# Patient Record
Sex: Female | Born: 1955 | Race: White | Hispanic: No | Smoking: Never smoker
Health system: Southern US, Community
[De-identification: ages and names within clinical notes are randomized; demographics above are authoritative.]

## PROBLEM LIST (undated history)

## (undated) DIAGNOSIS — M1990 Unspecified osteoarthritis, unspecified site: Secondary | ICD-10-CM

## (undated) DIAGNOSIS — M549 Dorsalgia, unspecified: Secondary | ICD-10-CM

## (undated) DIAGNOSIS — E213 Hyperparathyroidism, unspecified: Secondary | ICD-10-CM

## (undated) DIAGNOSIS — I10 Essential (primary) hypertension: Secondary | ICD-10-CM

## (undated) DIAGNOSIS — G8929 Other chronic pain: Secondary | ICD-10-CM

## (undated) HISTORY — DX: Hyperparathyroidism, unspecified: E21.3

## (undated) HISTORY — PX: APPENDECTOMY: SHX54

---

## 1992-07-09 HISTORY — PX: ABDOMINAL HYSTERECTOMY: SHX81

## 2001-02-10 ENCOUNTER — Other Ambulatory Visit: Admission: RE | Admit: 2001-02-10 | Discharge: 2001-02-10 | Payer: Self-pay | Admitting: Dermatology

## 2003-10-22 ENCOUNTER — Ambulatory Visit (HOSPITAL_COMMUNITY): Admission: RE | Admit: 2003-10-22 | Discharge: 2003-10-22 | Payer: Self-pay | Admitting: Family Medicine

## 2003-12-17 ENCOUNTER — Ambulatory Visit (HOSPITAL_COMMUNITY): Admission: RE | Admit: 2003-12-17 | Discharge: 2003-12-17 | Payer: Self-pay | Admitting: Family Medicine

## 2007-01-09 ENCOUNTER — Emergency Department (HOSPITAL_COMMUNITY): Admission: EM | Admit: 2007-01-09 | Discharge: 2007-01-09 | Payer: Self-pay | Admitting: Emergency Medicine

## 2007-01-17 ENCOUNTER — Ambulatory Visit (HOSPITAL_COMMUNITY): Admission: RE | Admit: 2007-01-17 | Discharge: 2007-01-17 | Payer: Self-pay | Admitting: Internal Medicine

## 2008-09-18 IMAGING — CR DG HIP COMPLETE 2+V*R*
3 series · 3 of 3 positions shown · non-contrast
Comparison: none

CLINICAL DATA: Right hip injury.  Migraine headaches. 
 RIGHT HIP ? 3 VIEW:

[view not recorded (1 of 3)]
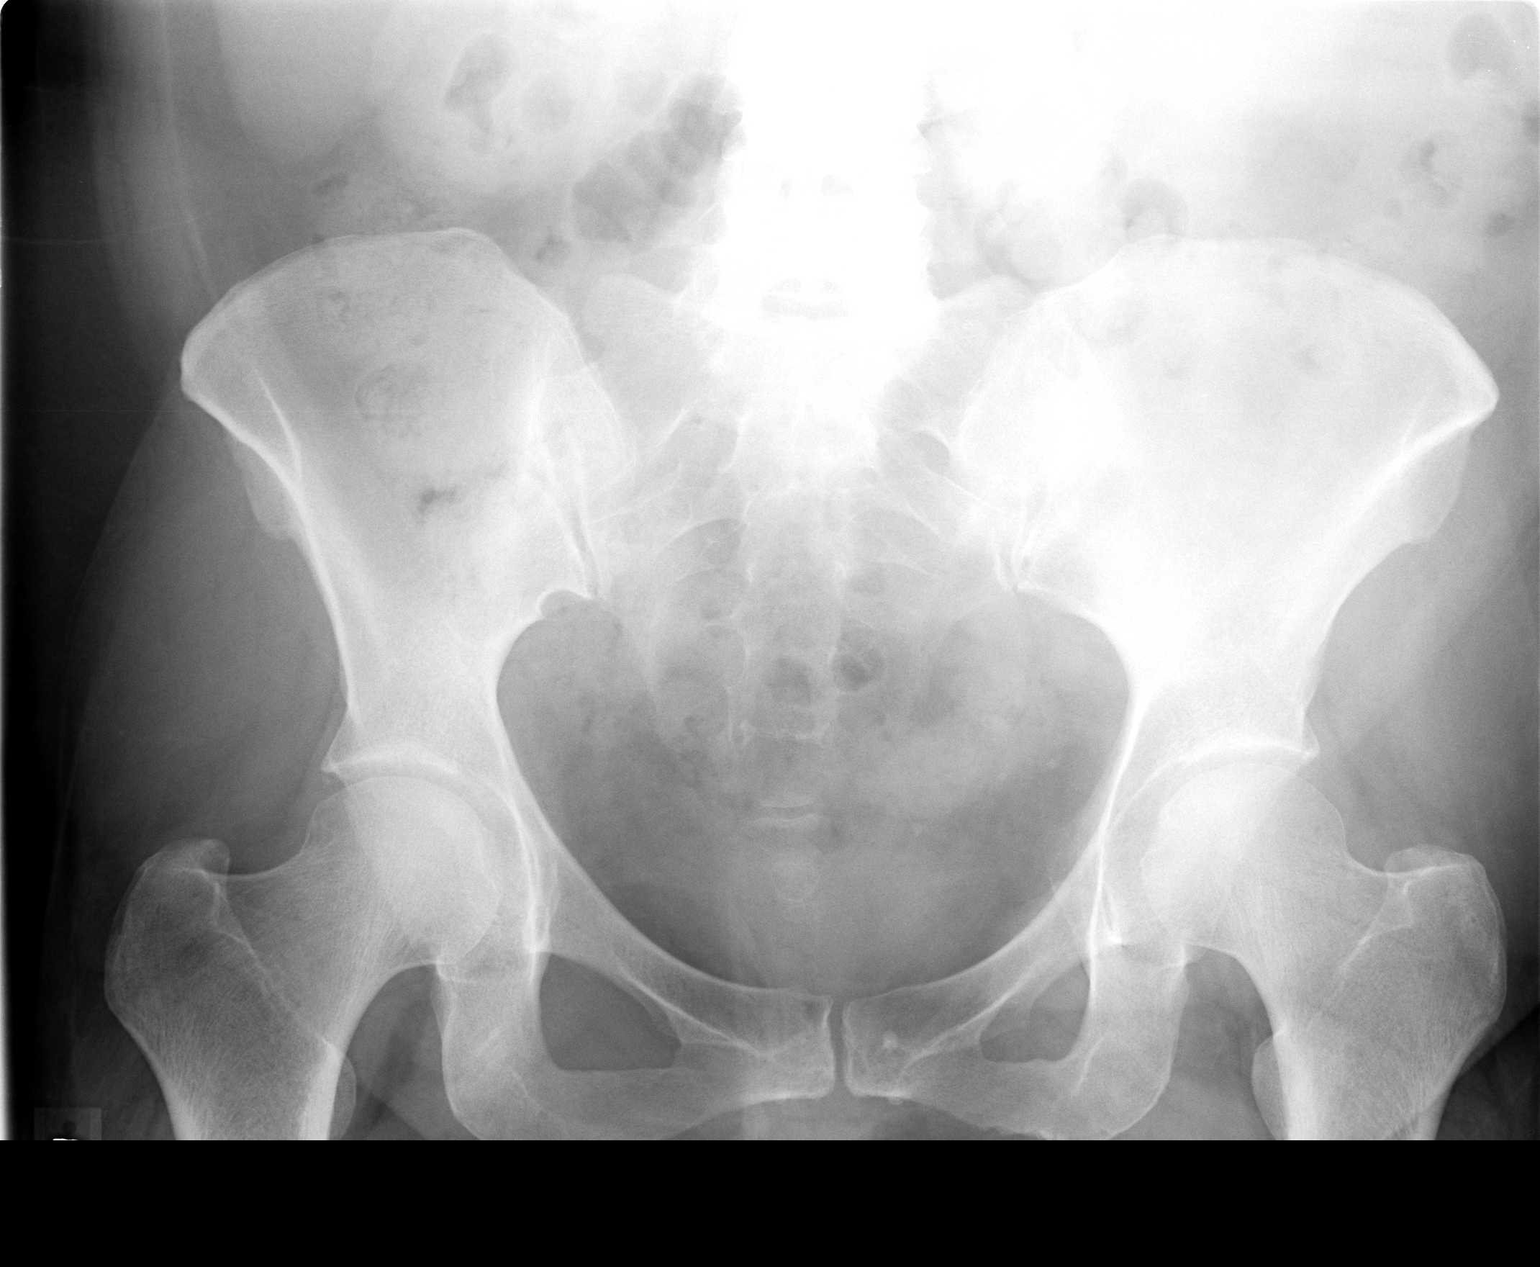

[view not recorded (2 of 3)]
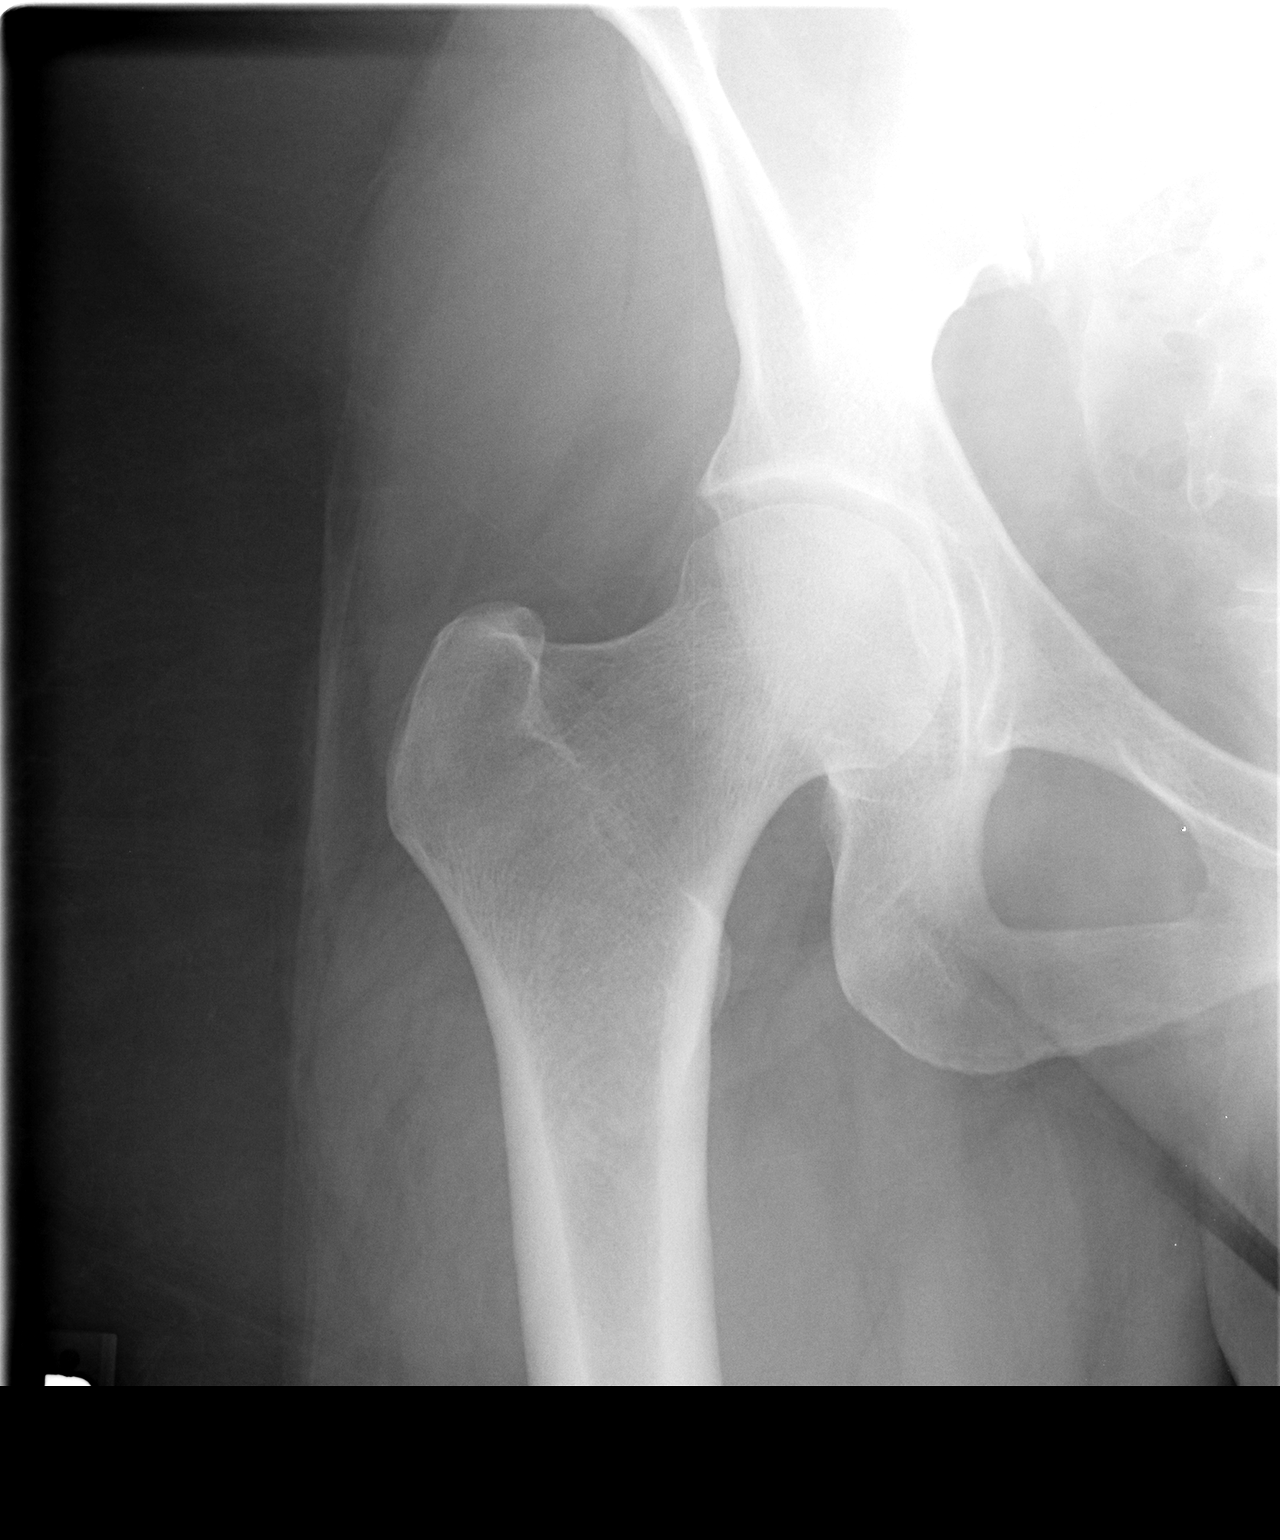

[view not recorded (3 of 3)]
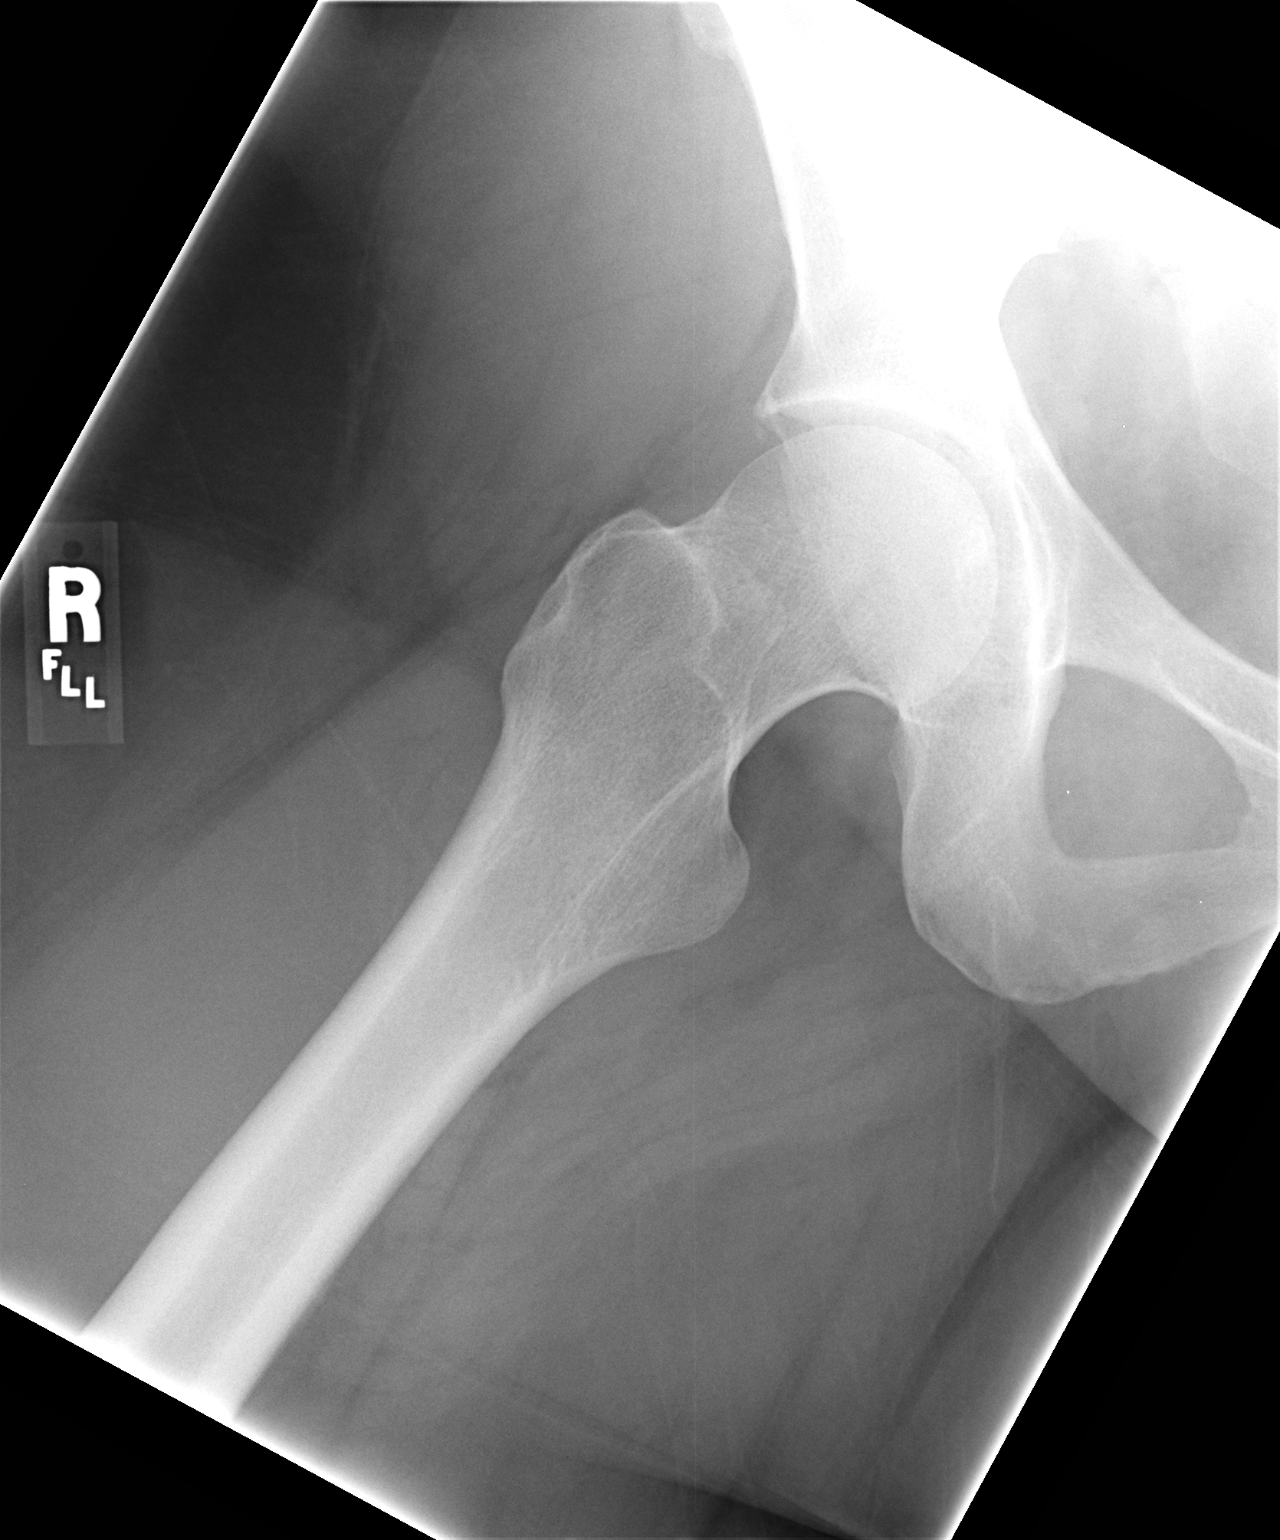

[3 of 3 positions shown; findings below may reference images not displayed]

FINDINGS: Both hips are located.  No fractures are identified.  A few small calcified phleboliths are present in the anatomic pelvis.  Small right os acetabula.
IMPRESSION: No acute injury to the right hip.

## 2008-09-18 IMAGING — CR DG LUMBAR SPINE COMPLETE 4+V
5 series · 5 of 5 positions shown · non-contrast
Comparison: none

CLINICAL DATA: Back pain

LUMBAR SPINE - 4 VIEW:

[view not recorded (1 of 5)]
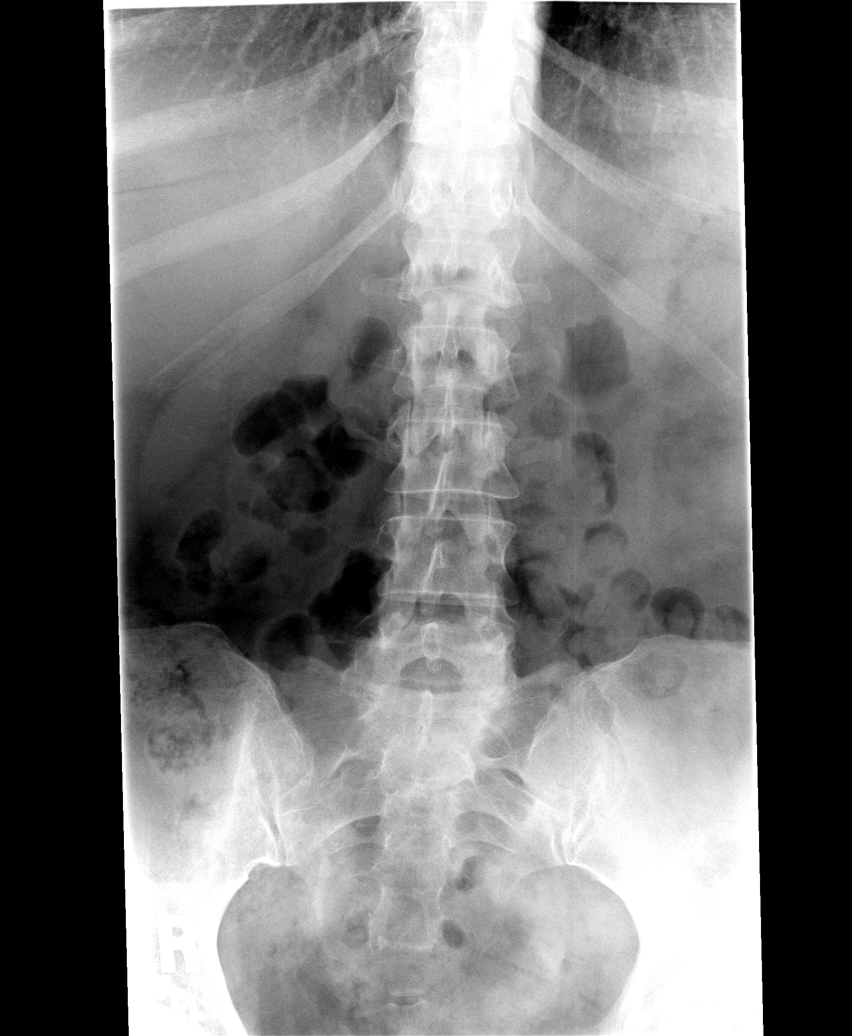

[view not recorded (2 of 5)]
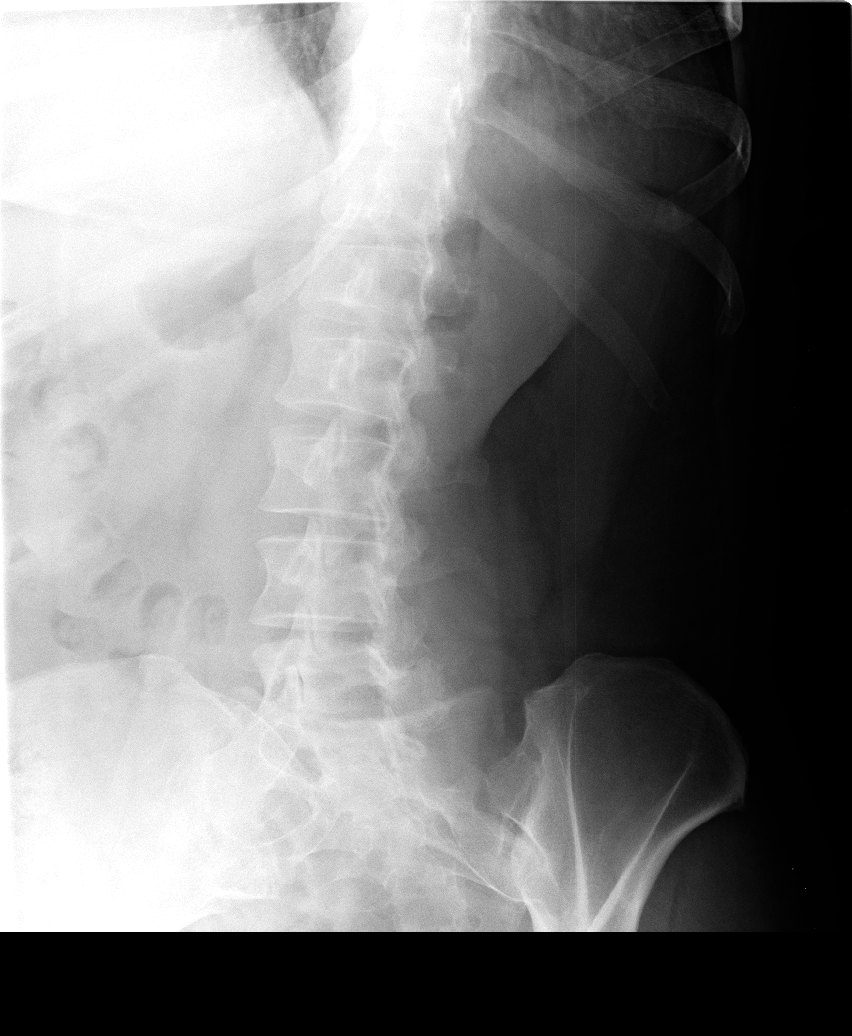

[view not recorded (3 of 5)]
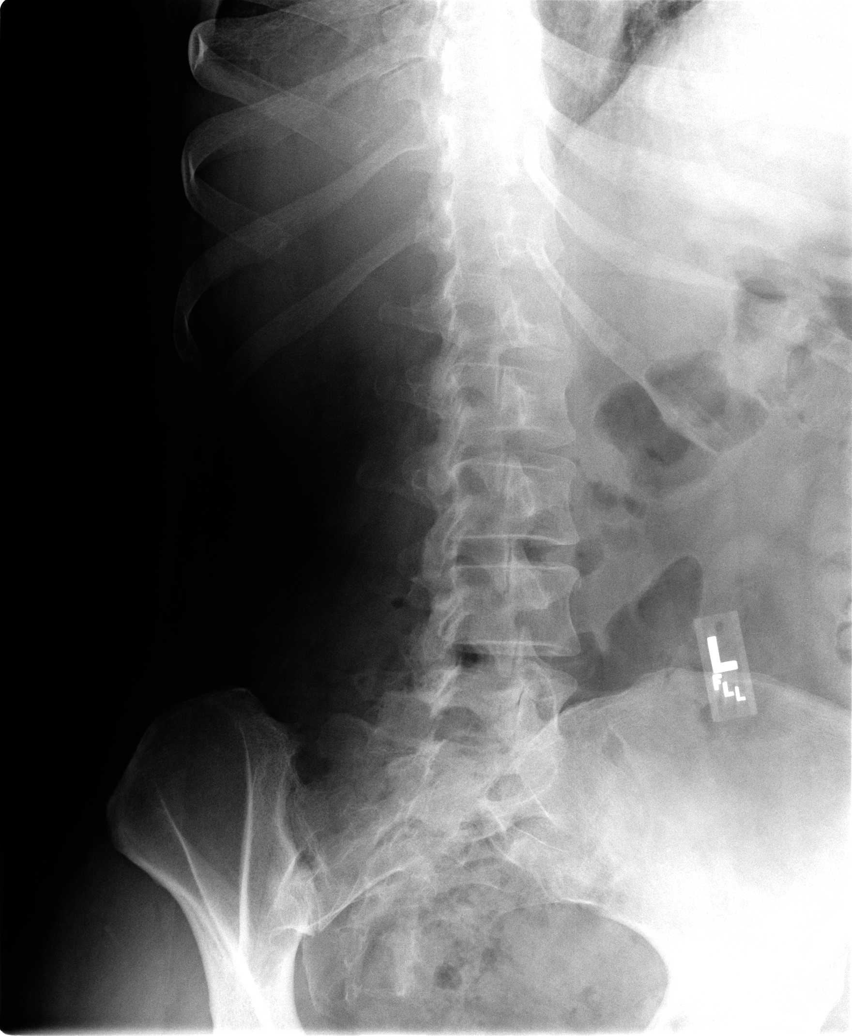

[view not recorded (4 of 5)]
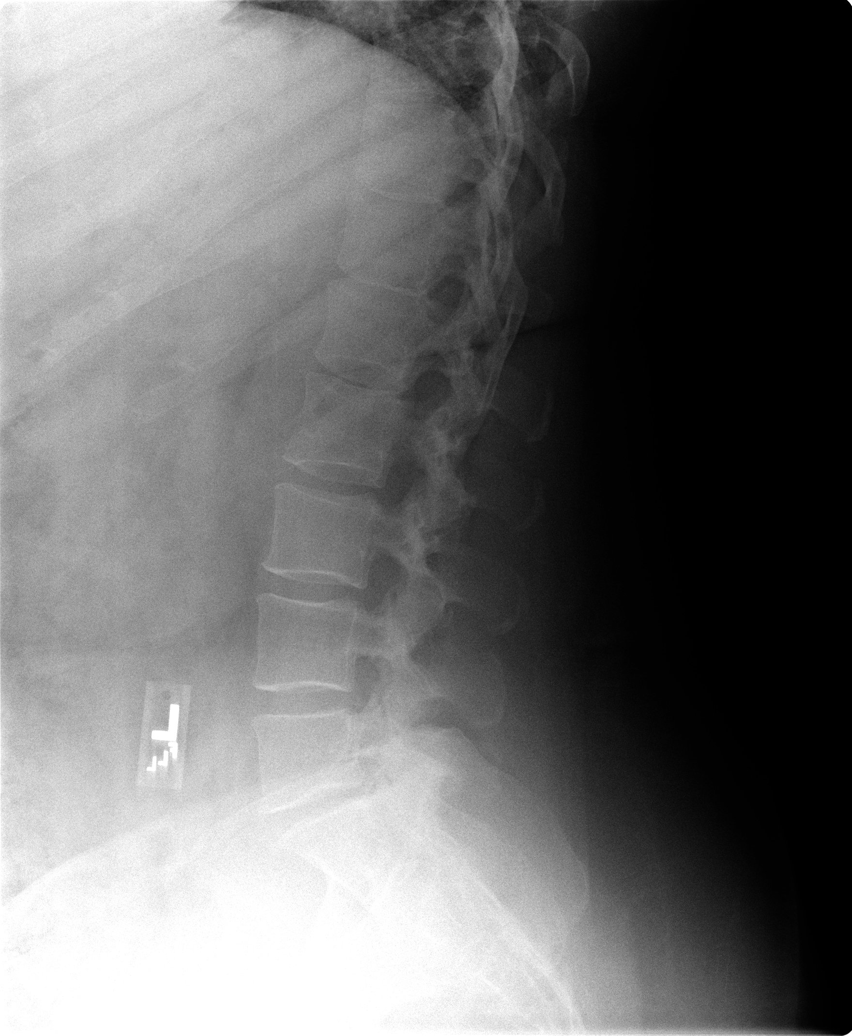

[view not recorded (5 of 5)]
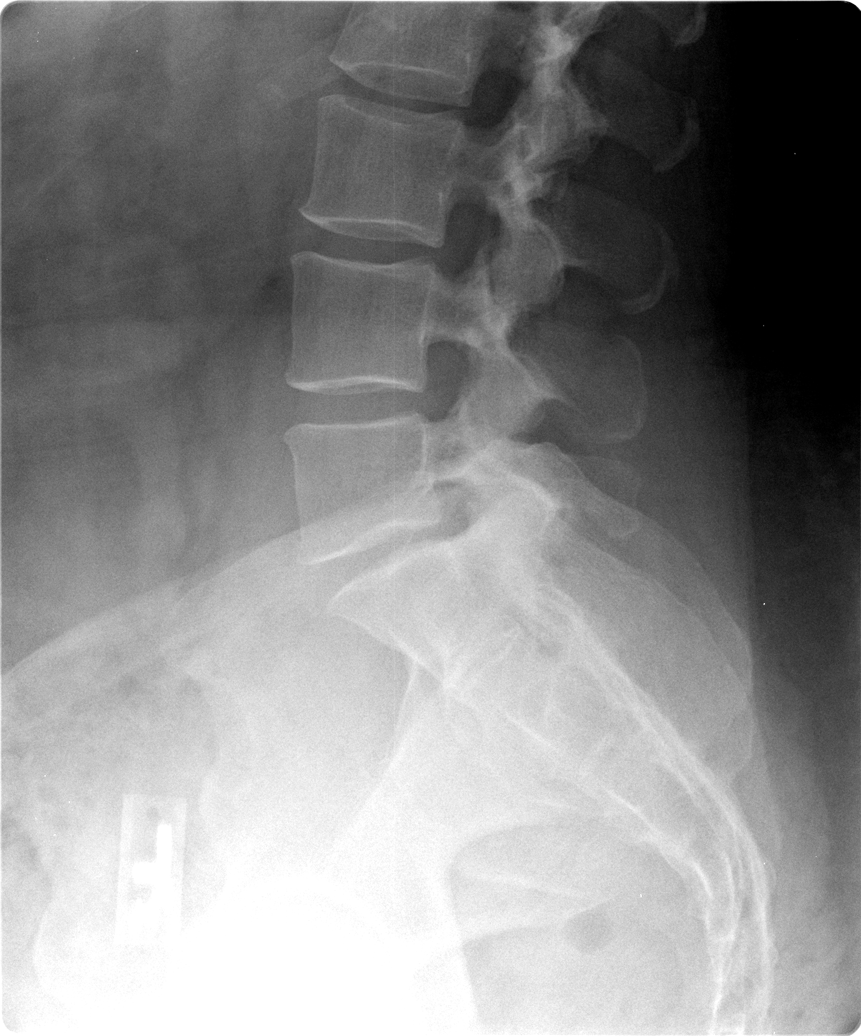

[5 of 5 positions shown; findings below may reference images not displayed]

FINDINGS: There is no evidence of lumbar spine fracture.  Alignment is normal. 
Intervertebral disc spaces are maintained, and no other significant bone
abnormalities are identified.
IMPRESSION: Negative lumbar spine radiographs.

## 2010-07-30 ENCOUNTER — Encounter: Payer: Self-pay | Admitting: Family Medicine

## 2010-07-31 ENCOUNTER — Encounter: Payer: Self-pay | Admitting: Family Medicine

## 2011-10-23 ENCOUNTER — Other Ambulatory Visit (HOSPITAL_COMMUNITY): Payer: Self-pay | Admitting: Physician Assistant

## 2011-10-23 ENCOUNTER — Ambulatory Visit (HOSPITAL_COMMUNITY)
Admission: RE | Admit: 2011-10-23 | Discharge: 2011-10-23 | Disposition: A | Discharge status: Home / Self Care | Payer: BC Managed Care – PPO | Source: Ambulatory Visit | Attending: Physician Assistant | Admitting: Physician Assistant

## 2011-10-23 DIAGNOSIS — R1013 Epigastric pain: Secondary | ICD-10-CM | POA: Insufficient documentation

## 2011-10-23 DIAGNOSIS — K7689 Other specified diseases of liver: Secondary | ICD-10-CM | POA: Insufficient documentation

## 2011-11-09 ENCOUNTER — Emergency Department (HOSPITAL_COMMUNITY)
Admission: EM | Admit: 2011-11-09 | Discharge: 2011-11-09 | Disposition: A | Discharge status: Home / Self Care | Payer: BC Managed Care – PPO | Attending: Emergency Medicine | Admitting: Emergency Medicine

## 2011-11-09 ENCOUNTER — Encounter (HOSPITAL_COMMUNITY): Payer: Self-pay | Admitting: Emergency Medicine

## 2011-11-09 ENCOUNTER — Emergency Department (HOSPITAL_COMMUNITY): Payer: BC Managed Care – PPO

## 2011-11-09 DIAGNOSIS — M25569 Pain in unspecified knee: Secondary | ICD-10-CM | POA: Insufficient documentation

## 2011-11-09 DIAGNOSIS — S2239XA Fracture of one rib, unspecified side, initial encounter for closed fracture: Secondary | ICD-10-CM

## 2011-11-09 DIAGNOSIS — W010XXA Fall on same level from slipping, tripping and stumbling without subsequent striking against object, initial encounter: Secondary | ICD-10-CM | POA: Insufficient documentation

## 2011-11-09 DIAGNOSIS — R079 Chest pain, unspecified: Secondary | ICD-10-CM | POA: Insufficient documentation

## 2011-11-09 DIAGNOSIS — I10 Essential (primary) hypertension: Secondary | ICD-10-CM | POA: Insufficient documentation

## 2011-11-09 HISTORY — DX: Essential (primary) hypertension: I10

## 2011-11-09 HISTORY — DX: Other chronic pain: G89.29

## 2011-11-09 HISTORY — DX: Unspecified osteoarthritis, unspecified site: M19.90

## 2011-11-09 HISTORY — DX: Dorsalgia, unspecified: M54.9

## 2011-11-09 MED ORDER — HYDROMORPHONE HCL PF 2 MG/ML IJ SOLN
2.0000 mg | Freq: Once | INTRAMUSCULAR | Status: AC
  Administered 2011-11-09: 2 mg via INTRAMUSCULAR
  Filled 2011-11-09: qty 1

## 2011-11-09 MED ORDER — OXYCODONE-ACETAMINOPHEN 5-325 MG PO TABS: 1.0000 | ORAL_TABLET | ORAL | Status: AC | PRN

## 2011-11-09 MED ORDER — TETANUS-DIPHTH-ACELL PERTUSSIS 5-2.5-18.5 LF-MCG/0.5 IM SUSP
0.5000 mL | Freq: Once | INTRAMUSCULAR | Status: DC
  Filled 2011-11-09: qty 0.5

## 2011-11-09 NOTE — Discharge Instructions (Signed)
Rib Fracture Your caregiver has diagnosed you as having a rib fracture (a break). This can occur by a blow to the chest, by a fall against a hard object, or by violent coughing or sneezing. There may be one or many breaks. Rib fractures may heal on their own within 3 to 8 weeks. The longer healing period is usually associated with a continued cough or other aggravating activities. HOME CARE INSTRUCTIONS   Avoid strenuous activity. Be careful during activities and avoid bumping the injured rib. Activities that cause pain pull on the fracture site(s) and are best avoided if possible.   Eat a normal, well-balanced diet. Drink plenty of fluids to avoid constipation.   Take deep breaths several times a day to keep lungs free of infection. Try to cough several times a day, splinting the injured area with a pillow. This will help prevent pneumonia.   Do not wear a rib belt or binder. These restrict breathing which can lead to pneumonia.   Only take over-the-counter or prescription medicines for pain, discomfort, or fever as directed by your caregiver.  SEEK MEDICAL CARE IF:  You develop a continual cough, associated with thick or bloody sputum. SEEK IMMEDIATE MEDICAL CARE IF:   You have a fever.   You have difficulty breathing.   You have nausea (feeling sick to your stomach), vomiting, or abdominal (belly) pain.   You have worsening pain, not controlled with medications.  Document Released: 06/25/2005 Document Revised: 06/14/2011 Document Reviewed: 11/27/2006 [employer] Patient Information 2012 [employer]. 

## 2011-11-09 NOTE — ED Notes (Addendum)
Pt states she is usually unstable on feet due to chronic back pain. Tripped and fell last night. States "i believe I lost consciousness". C/o L rib pain, L side of neck pain, bilateral knee pain. Bruising noted to r leg. Abrasion to L knee. Bruising to r elbow/arm. Alert/oirented. No marks on head. Denies pain to head. NAD at this time. Pt appears uncomfortable at this time.denies headache/vision changes/vomiting

## 2011-11-09 NOTE — ED Provider Notes (Addendum)
History   This chart was scribed for Joya Gaskins, MD by Shari Heritage. The patient was seen in room APA04/APA04. Patient's care was started at 0747.   CSN: [id number]  Arrival date & time 11/09/11  4098   First MD Initiated Contact with Patient 11/09/11 [phone]      Chief Complaint  Patient presents with  . Fall  . Loss of Consciousness     HPI Emma Williams is a 56 y.o. female who presents to the Emergency Department complaining of a fall last night associated with moderate to severe constant left rib pain, neck pain and left knee pain. Patient reports that she may have lost consciousness after falling. Patient said she lied on the floor for several seconds before getting up again. Patient reports being able to walk this morning, but after walking around she began to feel pain in hips and ribs. Patient denies headache, vomiting, back pain, abdominal pain. No SOB reported.  No focal weakness.    Past Medical History  Diagnosis Date  . Chronic back pain   . Hypertension   . Arthritis     Past Surgical History  Procedure Date  . Abdominal hysterectomy   . Appendectomy     History reviewed. No pertinent family history.  History  Substance Use Topics  . Smoking status: Never Smoker   . Smokeless tobacco: Not on file  . Alcohol Use: Yes     weekly    OB History    Grav Para Term Preterm Abortions TAB SAB Ect Mult Living                  Review of Systems A complete 10 system review of systems was obtained and all systems are negative except as noted in the HPI and PMH.   Allergies  Indocin and Prednisone  Home Medications   Current Outpatient Rx  Name Route Sig Dispense Refill  . HYDROCHLOROTHIAZIDE 25 MG PO TABS Oral Take 25 mg by mouth every morning.    Marland Kitchen LOSARTAN POTASSIUM 50 MG PO TABS Oral Take 50 mg by mouth every morning.    . ADULT MULTIVITAMIN W/MINERALS CH Oral Take 1 tablet by mouth once a week.    Marland Kitchen POTASSIUM 99 MG PO TABS Oral Take 1 tablet by mouth  every morning.    Marland Kitchen PRESCRIPTION MEDICATION  Fluid pill      BP 156/91  Pulse 83  Temp 98.7 F (37.1 C)  Resp 24  Ht 5\' 5"  (1.651 m)  Wt 240 lb (108.863 kg)  BMI 39.94 kg/m2  SpO2 96%  Physical Exam CONSTITUTIONAL: Well developed/well nourished HEAD AND FACE: Normocephalic/atraumatic EYES: EOMI/PERRL ENMT: Mucous membranes moist NECK: supple no meningeal signs, no c-spine tenderness SPINE:entire spine nontender, NEXUS criteria met CV: S1/S2 noted, no murmurs/rubs/gallops noted LUNGS: Lungs are clear to auscultation bilaterally, no apparent distress, left lateral rib tenderness, no crepitance ABDOMEN: soft, nontender, no rebound or guarding, no LUQ tenderness GU:no cva tenderness NEURO: Pt is awake/alert, moves all extremitiesx4, GCS 15 EXTREMITIES: pulses normal, full ROM, left patellar tenderness.  All other extremities/joints palpated/ranged and nontender.  Small abrasions to knee SKIN: warm, color normal PSYCH: no abnormalities of mood noted  ED Course  Procedures  DIAGNOSTIC STUDIES: Oxygen Saturation is 96% on room air, adequate by my interpretation.    COORDINATION OF CARE: 8:01AM-Patient informed of current plan for treatment and evaluation and agrees with plan at this time. Patient will receive pain medication. At this point, given  questionable LOC, no headache/vomiting and this occurred last night, suspicion for head injury is low, will defer ct head for now.  Pt does take ASA but no anticoagulants  10:03 AM Pt improved, no complaints.  Incentive spirometer ordered  Pt walked around ED in no distress, monitored after pain meds, stable for d/c and we discussed strict return precautions  DEFINITIVE FRACTURE CARE RIB FRACTURE USE INCENTIVE SPIROMETER, PAIN MEDS, INSTRUCTED TO RETURN FOR SHORTNESS OF BREATH, WORSENED PAIN, FEVER ABOVE 100.37F, OR HEMOPTYSIS  Dg Ribs Unilateral W/chest Left  11/09/2011  *RADIOLOGY REPORT*  Clinical Data: Left lateral rib pain post  fall.  LEFT RIBS AND CHEST - 3+ VIEW  Comparison: None.  Findings: Slightly angulated appearance of the left tenth and eleventh costochondral junction and possibly related to the recent injury.  No pneumothorax.  No infiltrate.  Central pulmonary vascular prominence without pulmonary edema.  Heart size top normal.  Tortuous aorta.  Mild scoliosis.  IMPRESSION: Slightly angulated appearance of the left tenth and eleventh costochondral junction and possibly related to the recent injury. No pneumothorax.  Tortuous aorta.  Central pulmonary vascular prominence.  Original Report Authenticated By: Fuller Canada, M.D.   Dg Knee Complete 4 Views Left  11/09/2011  *RADIOLOGY REPORT*  Clinical Data:  left lateral knee pain post fall  LEFT KNEE - COMPLETE 4+ VIEW  Comparison: None.  Findings: Four views of the left knee submitted.  No acute fracture or subluxation.  No joint effusion.  Mild prepatellar soft tissue swelling.  IMPRESSION: No acute fracture or subluxation.  Mild prepatellar soft tissue swelling.  Original Report Authenticated By: Natasha Mead, M.D.      MDM  Nursing notes reviewed and considered in documentation xrays reviewed and considered   I personally performed the services described in this documentation, which was scribed in my presence. The recorded information has been reviewed and considered.           Joya Gaskins, MD 11/09/11 1528  Joya Gaskins, MD 11/09/11 [phone]

## 2011-11-09 NOTE — ED Notes (Signed)
Pt awake, alert and oriented. Pt ambulating with no difficulty. OK to be discharged and drive per Dr. Bebe Shaggy.

## 2013-04-10 ENCOUNTER — Encounter (HOSPITAL_COMMUNITY): Payer: Self-pay | Admitting: Emergency Medicine

## 2013-04-10 ENCOUNTER — Other Ambulatory Visit (HOSPITAL_COMMUNITY): Payer: Self-pay | Admitting: Family Medicine

## 2013-04-10 ENCOUNTER — Observation Stay (HOSPITAL_COMMUNITY)
Admission: EM | Admit: 2013-04-10 | Discharge: 2013-04-11 | DRG: 100 | Disposition: A | Discharge status: Home / Self Care | Payer: BC Managed Care – PPO | Attending: Internal Medicine | Admitting: Internal Medicine

## 2013-04-10 ENCOUNTER — Emergency Department (HOSPITAL_COMMUNITY): Payer: BC Managed Care – PPO

## 2013-04-10 ENCOUNTER — Observation Stay (HOSPITAL_COMMUNITY): Payer: BC Managed Care – PPO

## 2013-04-10 ENCOUNTER — Other Ambulatory Visit: Payer: Self-pay

## 2013-04-10 DIAGNOSIS — R0989 Other specified symptoms and signs involving the circulatory and respiratory systems: Principal | ICD-10-CM | POA: Diagnosis present

## 2013-04-10 DIAGNOSIS — M129 Arthropathy, unspecified: Secondary | ICD-10-CM | POA: Diagnosis present

## 2013-04-10 DIAGNOSIS — R0600 Dyspnea, unspecified: Secondary | ICD-10-CM | POA: Diagnosis present

## 2013-04-10 DIAGNOSIS — I10 Essential (primary) hypertension: Secondary | ICD-10-CM | POA: Diagnosis present

## 2013-04-10 DIAGNOSIS — I369 Nonrheumatic tricuspid valve disorder, unspecified: Secondary | ICD-10-CM

## 2013-04-10 DIAGNOSIS — G8929 Other chronic pain: Secondary | ICD-10-CM | POA: Diagnosis present

## 2013-04-10 DIAGNOSIS — K7689 Other specified diseases of liver: Secondary | ICD-10-CM | POA: Diagnosis present

## 2013-04-10 DIAGNOSIS — R609 Edema, unspecified: Secondary | ICD-10-CM

## 2013-04-10 DIAGNOSIS — F1010 Alcohol abuse, uncomplicated: Secondary | ICD-10-CM | POA: Diagnosis present

## 2013-04-10 DIAGNOSIS — R0602 Shortness of breath: Secondary | ICD-10-CM

## 2013-04-10 DIAGNOSIS — Z6841 Body Mass Index (BMI) 40.0 and over, adult: Secondary | ICD-10-CM

## 2013-04-10 DIAGNOSIS — R0609 Other forms of dyspnea: Principal | ICD-10-CM | POA: Diagnosis present

## 2013-04-10 DIAGNOSIS — R0789 Other chest pain: Secondary | ICD-10-CM

## 2013-04-10 DIAGNOSIS — I509 Heart failure, unspecified: Secondary | ICD-10-CM

## 2013-04-10 LAB — COMPREHENSIVE METABOLIC PANEL
Albumin: 3.9 g/dL (ref 3.5–5.2)
BUN: 19 mg/dL (ref 6–23)
Creatinine, Ser: 0.65 mg/dL (ref 0.50–1.10)
Glucose, Bld: 113 mg/dL — ABNORMAL HIGH (ref 70–99)
Total Protein: 7.2 g/dL (ref 6.0–8.3)

## 2013-04-10 LAB — CBC WITH DIFFERENTIAL/PLATELET
Basophils Absolute: 0 10*3/uL (ref 0.0–0.1)
Basophils Relative: 0 % (ref 0–1)
Eosinophils Absolute: 0.1 10*3/uL (ref 0.0–0.7)
Hemoglobin: 13.9 g/dL (ref 12.0–15.0)
Lymphocytes Relative: 26 % (ref 12–46)
Lymphs Abs: 1.5 10*3/uL (ref 0.7–4.0)
MCH: 30.4 pg (ref 26.0–34.0)
Monocytes Relative: 9 % (ref 3–12)
Neutrophils Relative %: 62 % (ref 43–77)
Platelets: 194 10*3/uL (ref 150–400)
RBC: 4.57 MIL/uL (ref 3.87–5.11)
WBC: 5.6 10*3/uL (ref 4.0–10.5)

## 2013-04-10 LAB — TROPONIN I
Troponin I: <0.3 ng/mL (ref <0.30)
Troponin I: <0.3 ng/mL (ref <0.30)
Troponin I: <0.3 ng/mL (ref <0.30)

## 2013-04-10 LAB — HEMOGLOBIN A1C: Hgb A1c MFr Bld: 5.6 % (ref <5.7)

## 2013-04-10 MED ORDER — HYDROCHLOROTHIAZIDE 25 MG PO TABS
25.0000 mg | ORAL_TABLET | Freq: Every morning | ORAL | Status: DC
  Administered 2013-04-11: 25 mg via ORAL
  Filled 2013-04-10: qty 1

## 2013-04-10 MED ORDER — NAPROXEN 250 MG PO TABS
250.0000 mg | ORAL_TABLET | Freq: Two times a day (BID) | ORAL | Status: DC
  Administered 2013-04-11: 250 mg via ORAL
  Filled 2013-04-10 (×2): qty 1

## 2013-04-10 MED ORDER — LORATADINE 10 MG PO TABS
10.0000 mg | ORAL_TABLET | Freq: Every day | ORAL | Status: DC
  Administered 2013-04-11: 10 mg via ORAL
  Filled 2013-04-10: qty 1

## 2013-04-10 MED ORDER — HEPARIN SODIUM (PORCINE) 5000 UNIT/ML IJ SOLN
5000.0000 [IU] | Freq: Three times a day (TID) | INTRAMUSCULAR | Status: DC
  Administered 2013-04-10 – 2013-04-11 (×3): 5000 [IU] via SUBCUTANEOUS
  Filled 2013-04-10 (×3): qty 1

## 2013-04-10 MED ORDER — NITROGLYCERIN 2 % TD OINT
1.0000 [in_us] | TOPICAL_OINTMENT | Freq: Once | TRANSDERMAL | Status: AC
  Administered 2013-04-10: 1 [in_us] via TOPICAL
  Filled 2013-04-10: qty 1

## 2013-04-10 MED ORDER — LOSARTAN POTASSIUM 50 MG PO TABS
50.0000 mg | ORAL_TABLET | Freq: Every morning | ORAL | Status: DC
  Administered 2013-04-11: 50 mg via ORAL
  Filled 2013-04-10: qty 1

## 2013-04-10 MED ORDER — IOHEXOL 300 MG/ML  SOLN
100.0000 mL | Freq: Once | INTRAMUSCULAR | Status: AC | PRN
  Administered 2013-04-10: 100 mL via INTRAVENOUS

## 2013-04-10 MED ORDER — ASPIRIN 325 MG PO TABS
325.0000 mg | ORAL_TABLET | Freq: Every day | ORAL | Status: DC
  Administered 2013-04-11: 325 mg via ORAL
  Filled 2013-04-10: qty 1

## 2013-04-10 MED ORDER — FUROSEMIDE 10 MG/ML IJ SOLN
40.0000 mg | Freq: Once | INTRAMUSCULAR | Status: AC
  Administered 2013-04-10: 40 mg via INTRAVENOUS
  Filled 2013-04-10: qty 4

## 2013-04-10 MED ORDER — NAPROXEN SODIUM 220 MG PO TABS: 220.0000 mg | ORAL_TABLET | Freq: Two times a day (BID) | ORAL | Status: DC

## 2013-04-10 MED ORDER — PSEUDOEPHEDRINE HCL 60 MG PO TABS: 30.0000 mg | ORAL_TABLET | Freq: Every day | ORAL | Status: DC | PRN

## 2013-04-10 MED ORDER — IOHEXOL 300 MG/ML  SOLN
50.0000 mL | Freq: Once | INTRAMUSCULAR | Status: AC | PRN
  Administered 2013-04-10: 50 mL via INTRAVENOUS

## 2013-04-10 NOTE — ED Notes (Signed)
Pt c/o sob "for several days" with chest pressure. Pressure to central chest that is worse with palpation. nonprod cough. Nad. Nondiaphoretic. States has had fluid gain and has gained approx 10 lbs in last week. Mild swelling noted to bilateral legs.

## 2013-04-10 NOTE — H&P (Signed)
[facility] History and Physical  FREDI HURTADO IHK:[id number] DOB: 05-Jan-1956 DOA: 04/10/2013  Referring physician: Dr. Bebe Shaggy, ER. PCP: Colette Ribas, MD    Chief Complaint: Dyspnea, leg and body swelling.  HPI: Emma Williams is a 57 y.o. female who describes approximately a week history of dyspnea, paroxysmal nocturnal dyspnea and swelling of her legs and body in general. She also describes some chest tightness although she did not say was chest pain. She did not have a cough or a fever. Evaluation in the emergency room shows an abnormal chest x-ray with the possibility of mild congestive heart failure although her BNP is not significantly elevated. She does not have a strong family history of ischemic heart disease, she is not diabetic, she does not smoke, she does have hypertension and she does drink excessive alcohol, 4-5 shots of bourbon every day. She is morbidly obese.   Review of Systems:  Apart from history of present illness, other systems are negative.  Past Medical History  Diagnosis Date  . Chronic back pain   . Hypertension   . Arthritis    Past Surgical History  Procedure Laterality Date  . Abdominal hysterectomy    . Appendectomy     Social History:  She is divorced, lives by herself. She works as a Architectural technologist at [employer]. She does not smoke cigarettes. As mentioned above, she drinks 4-5 shots of bourbon every day.   Allergies  Allergen Reactions  . Indocin [Indomethacin] Other (See Comments)    Reaction: migraine headaches  . Prednisone Other (See Comments)    Reaction:Leg cramps    History reviewed. No pertinent family history. no family history of early coronary artery disease.   Prior to Admission medications   Medication Sig Start Date End Date Taking? Authorizing Provider  aspirin 325 MG tablet Take 325 mg by mouth daily.   Yes Historical Provider, MD  fexofenadine (ALLEGRA) 180 MG tablet Take 180 mg by mouth daily.   Yes  Historical Provider, MD  hydrochlorothiazide (HYDRODIURIL) 25 MG tablet Take 25 mg by mouth every morning.   Yes Historical Provider, MD  losartan (COZAAR) 50 MG tablet Take 50 mg by mouth every morning.   Yes Historical Provider, MD  Multiple Vitamin (MULITIVITAMIN WITH MINERALS) TABS Take 1 tablet by mouth daily.    Yes Historical Provider, MD  naproxen sodium (ALEVE) 220 MG tablet Take 220 mg by mouth 2 (two) times daily with a meal.   Yes Historical Provider, MD  Potassium 99 MG TABS Take 1 tablet by mouth every morning.   Yes Historical Provider, MD  pseudoephedrine (SUDAFED) 30 MG tablet Take 30 mg by mouth daily as needed for congestion.   Yes Historical Provider, MD   Physical Exam: Filed Vitals:   04/10/13 1100  BP: 133/72  Pulse: 74  Temp:   Resp: 20     General:  She is morbidly obese. She does not appear to be in any respiratory distress.  Eyes: No pallor. No jaundice.  ENT: Unremarkable.  Neck: Thick neck. No lymphadenopathy.  Cardiovascular: Heart sounds are present without gallop rhythm. There is a systolic murmur, loudest in the aortic area. He does not seem to radiate to the carotids.  Respiratory: Lung fields are clear. No wheezing. No crackles. No bronchial breathing.  Abdomen: Soft but appears to be tender in the right upper quadrant more so than other areas of the abdomen. There is a suspicion of hepatomegaly.  Skin: No rash.  Musculoskeletal: No acute joint abnormalities.  Psychiatric: Appropriate affect.  Neurologic: Alert and orientated without any focal neurological signs.  Labs on Admission:  Basic Metabolic Panel:  Recent Labs Lab 04/10/13 1027  NA 139  K 4.0  CL 102  CO2 24  GLUCOSE 113*  BUN 19  CREATININE 0.65  CALCIUM 10.5   Liver Function Tests:  Recent Labs Lab 04/10/13 1027  AST 58*  ALT 58*  ALKPHOS 64  BILITOT 0.3  PROT 7.2  ALBUMIN 3.9     CBC:  Recent Labs Lab 04/10/13 1027  WBC 5.6  NEUTROABS 3.5  HGB  13.9  HCT 40.1  MCV 87.7  PLT 194   Cardiac Enzymes:  Recent Labs Lab 04/10/13 1027  TROPONINI <0.30    BNP (last 3 results)  Recent Labs  04/10/13 1027  PROBNP 171.3*      Radiological Exams on Admission: Dg Chest Portable 1 View  04/10/2013   CLINICAL DATA:  Shortness of breath and chest pain.  And  EXAM: PORTABLE CHEST - 1 VIEW  COMPARISON:  Chest x-ray 11/09/2011.  FINDINGS: The heart size is mildly enlarged. Mild pulmonary vascular congestion is present. No focal airspace consolidation is evident. The visualized soft tissues and bony thorax are unremarkable.  IMPRESSION: 1. Borderline cardiomegaly and mild pulmonary vascular congestion.   Electronically Signed   By: Gennette Pac M.D.   On: 04/10/2013 10:28    EKG: Independently reviewed. Normal sinus rhythm without any acute ST-T wave elevation.  Assessment/Plan   1. Dyspnea of unclear etiology. Possibilities include mild congestive heart failure or decompensated liver disease. She does not have any other features of chronic liver disease, however. 2. Alcohol abuse. She drinks 4-5 shots of bourbon every day. Liver enzymes are slightly elevated. We will check an INR tomorrow morning. 3. Hypertension. 4. Morbid obesity.  Plan: 1. Admit to medical floor. 2. Echocardiogram. 3. CT scan of the abdomen and pelvis. 4. I've counseled her against alcohol abuse.  Further recommendations will depend on patient's hospital progress. if consultant consulted, please document name and whether formally or informally consulted  Code Status: Full code.  Family Communication: Discuss plan with patient at the bedside.   Disposition Plan: Home when medically stable.   Time spent: 45 minutes.  Wilson Singer [facility] Pager [phone].  If 7PM-7AM, please contact night-coverage [email or web address] Password [email or web address] 04/10/2013, 12:21 PM

## 2013-04-10 NOTE — Progress Notes (Signed)
PT's O2 sat 89-91 -room air , resting. Placed PT on 2 lpm O2 Sat 97---- oxygen is for SOB and Sleep.

## 2013-04-10 NOTE — ED Notes (Signed)
Admitting MD at bedside.

## 2013-04-10 NOTE — ED Provider Notes (Signed)
CSN: [id number]     Arrival date & time 04/10/13  1478 History  This chart was scribed for Emma Gaskins, MD by Bennett Scrape, ED Scribe. This patient was seen in room APA02/APA02 and the patient's care was started at 10:08 AM.    Chief Complaint  Patient presents with  . Shortness of Breath  . Chest Pain    Patient is a 57 y.o. female presenting with shortness of breath. The history is provided by the patient. No language interpreter was used.  Shortness of Breath Severity:  Moderate Onset quality:  Gradual Duration:  1 week Timing:  Constant Progression:  Worsening Chronicity:  Recurrent Context comment:  "fluid retention" Relieved by:  Nothing Worsened by:  Exertion Ineffective treatments:  None tried Associated symptoms: chest pain and cough   Associated symptoms: no abdominal pain, no fever and no sputum production   Risk factors: no family hx of DVT and no hx of PE/DVT     HPI Comments: Emma Williams is a 57 y.o. female who presents to the Emergency Department complaining of SOB for the past week with an associated 10 lb weight gain, bilateral leg swelling and CP described as pressure that she attributes to fluid retention. She reports that the SOB has been gradually worsening and is aggravated by exertion. She denies any changes with laying flat. She denies having a h/o DVT/PE, pulmonary or cardiac conditions. She denies having any prior CHF diagnoses but reports prior episodes of the same attributed to fluid retention. She reports a recent NP cough but denies any known fevers or abdominal pain as associated symptoms.   PCP is Dr. Phillips Odor at [place].  Past Medical History  Diagnosis Date  . Chronic back pain   . Hypertension   . Arthritis    Past Surgical History  Procedure Laterality Date  . Abdominal hysterectomy    . Appendectomy     History reviewed. No pertinent family history. History  Substance Use Topics  . Smoking status: Never Smoker   . Smokeless  tobacco: Not on file  . Alcohol Use: Yes     Comment: liquor daily "several drinks"   No OB history provided.  Review of Systems  Constitutional: Positive for unexpected weight change (10 lbs). Negative for fever.  Respiratory: Positive for cough and shortness of breath. Negative for sputum production.   Cardiovascular: Positive for chest pain and leg swelling.  Gastrointestinal: Negative for abdominal pain.  All other systems reviewed and are negative.    Allergies  Indocin and Prednisone  Home Medications   Current Outpatient Rx  Name  Route  Sig  Dispense  Refill  . hydrochlorothiazide (HYDRODIURIL) 25 MG tablet   Oral   Take 25 mg by mouth every morning.         Marland Kitchen losartan (COZAAR) 50 MG tablet   Oral   Take 50 mg by mouth every morning.         . Multiple Vitamin (MULITIVITAMIN WITH MINERALS) TABS   Oral   Take 1 tablet by mouth once a week.         . Potassium 99 MG TABS   Oral   Take 1 tablet by mouth every morning.         Marland Kitchen PRESCRIPTION MEDICATION      Fluid pill          Triage Vitals: BP 183/82  Pulse 82  Temp(Src) 98 F (36.7 C) (Oral)  Resp 22  SpO2 97%  Physical Exam  Nursing note and vitals reviewed.  CONSTITUTIONAL: Well developed/well nourished HEAD: Normocephalic/atraumatic EYES: EOMI/PERRL ENMT: Mucous membranes moist NECK: supple no meningeal signs SPINE:entire spine nontender CV: S1/S2 noted, no murmurs/rubs/gallops noted LUNGS: Lungs are clear to auscultation bilaterally, no apparent distress ABDOMEN: soft, nontender, no rebound or guarding NEURO: Pt is awake/alert, moves all extremitiesx4 EXTREMITIES: pulses normal, full ROM, symmetric minimal pitting edema  SKIN: warm, color normal PSYCH: no abnormalities of mood noted  ED Course  Procedures   DIAGNOSTIC STUDIES: Oxygen Saturation is 97% on room air, normal by my interpretation.    COORDINATION OF CARE: 10:15 AM-Discussed treatment plan which includes CXR, CBC  panel, CMP and troponin with pt at bedside and pt agreed to plan.    11:38 AM Suspect mild new onset CHF Will admit D/w dr Karilyn Cota Pt updated on plan Labs Review Labs Reviewed  CBC WITH DIFFERENTIAL  COMPREHENSIVE METABOLIC PANEL  TROPONIN I   Imaging Review No results found.  MDM  No diagnosis found. Nursing notes including past medical history and social history reviewed and considered in documentation xrays reviewed and considered Labs/vital reviewed and considered Previous records reviewed and considered - PCP office records reviewed     Date: 04/10/2013 0959am  Rate: 72  Rhythm: normal sinus rhythm  QRS Axis: normal  Intervals: normal  ST/T Wave abnormalities: nonspecific ST changes  Conduction Disutrbances:none  Narrative Interpretation:   Old EKG Reviewed: changes noted ekg from office reviewed and previous ekg shows Right axis deviation    I personally performed the services described in this documentation, which was scribed in my presence. The recorded information has been reviewed and is accurate.      Emma Gaskins, MD 04/10/13 [phone]

## 2013-04-10 NOTE — ED Notes (Signed)
To CT  Via Wheelchair with xray staff.

## 2013-04-11 LAB — COMPREHENSIVE METABOLIC PANEL
AST: 40 U/L — ABNORMAL HIGH (ref 0–37)
Albumin: 3.6 g/dL (ref 3.5–5.2)
Alkaline Phosphatase: 58 U/L (ref 39–117)
Calcium: 10.5 mg/dL (ref 8.4–10.5)
Chloride: 98 mEq/L (ref 96–112)
GFR calc Af Amer: >90 mL/min (ref >90)
Potassium: 3.7 mEq/L (ref 3.5–5.1)
Total Bilirubin: 0.5 mg/dL (ref 0.3–1.2)
Total Protein: 6.7 g/dL (ref 6.0–8.3)

## 2013-04-11 LAB — PROTIME-INR
INR: 1.03 (ref 0.00–1.49)
Prothrombin Time: 13.3 seconds (ref 11.6–15.2)

## 2013-04-11 LAB — TROPONIN I: Troponin I: <0.3 ng/mL (ref <0.30)

## 2013-04-11 NOTE — Discharge Summary (Signed)
Physician Discharge Summary  Emma Williams:[id number] DOB: [birth year] DOA: 04/10/2013  PCP: Colette Ribas, MD  Admit date: 04/10/2013 Discharge date: 04/11/2013  Time spent: Greater than 30 minutes  Recommendations for Outpatient Follow-up:  1. Follow with primary care physician within the next couple weeks to followup on the right kidney mass.   Discharge Diagnoses:  1. Dyspnea of unclear etiology. Echocardiogram unremarkable except for grade 1 diastolic dysfunction. 2. Hypertension. 3. Alcohol abuse. 4. Morbid obesity. 5. Fatty liver.  Discharge Condition: Stable and improved.  Diet recommendation: Regular. No alcohol.  Filed Weights   04/10/13 1425  Weight: 117.935 kg (260 lb)    History of present illness:  This pleasant 57 year old lady presents to the hospital with symptoms of dyspnea and leg and body swelling. Please see initial history as outlined below: HPI: Emma Williams is a 57 y.o. female who describes approximately a week history of dyspnea, paroxysmal nocturnal dyspnea and swelling of her legs and body in general. She also describes some chest tightness although she did not say was chest pain. She did not have a cough or a fever. Evaluation in the emergency room shows an abnormal chest x-ray with the possibility of mild congestive heart failure although her BNP is not significantly elevated.  She does not have a strong family history of ischemic heart disease, she is not diabetic, she does not smoke, she does have hypertension and she does drink excessive alcohol, 4-5 shots of bourbon every day. She is morbidly obese.  Hospital Course:  The patient was admitted overnight and serial cardiac enzymes were negative. She underwent CT scan of her abdomen in view of clinical findings. CT scan report is outlined below and although she has a fatty liver, which she has known previously, she also has a 1.5 cm exophytic right renal mass which will need further investigation  with a urologist. Echocardiogram was unremarkable and the report is outlined below. She feels much better this morning and her dyspnea and swelling seems to improved. We did discuss her excessive alcohol consumption and the dangers of alcohol abuse. She drinks 4-5 shots of bourbon every day. She is now medically stable for discharge. She will follow with her primary care physician within the next couple weeks.  Procedures:  Echocardiogram:  Study Conclusions  - Left ventricle: The cavity size was normal. There was fmild ocal basal hypertrophy. Systolic function was vigorous. The estimated ejection fraction was in the range of 65% to 70%. Wall motion was normal; there were no regional wall motion abnormalities. Doppler parameters are consistent with abnormal left ventricular relaxation (grade 1 diastolic dysfunction). - Aortic valve: Mildly calcified annulus. Normal thickness leaflets. Uncertain number of leaflets. Valve area: 2.6cm^2(VTI). Valve area: 2.56cm^2 (Vmax). - Left atrium: The atrium was mildly to moderately dilated. - Pulmonary arteries: PA peak pressure: 34mm Hg (S). Transthoracic echocardiography. M-mode, complete 2D, spectral Doppler, and color Doppler. Height: Height: 165.1cm. Height: 65in. Weight: Weight: 117kg. Weight: 257.4lb. Body mass index: BMI: 42.9kg/m^2. Body surface area: BSA: 2.83m^2. Blood pressure: 133/72. Patient status: Inpatient. Location: Bedside.  Consultations:  None.  Discharge Exam: Filed Vitals:   04/11/13 0425  BP: 156/95  Pulse: 61  Temp: 98.4 F (36.9 C)  Resp: 20    General: She looks systemically well. She is morbidly obese. Cardiovascular: Heart sounds are present without gallop rhythm. Respiratory: Lung fields are entirely clear. She is alert and oriented.  Discharge Instructions  Discharge Orders   Future Orders Complete By Expires  Diet - low sodium heart healthy  As directed    Increase activity slowly  As directed         Medication List         ALEVE 220 MG tablet  Generic drug:  naproxen sodium  Take 220 mg by mouth 2 (two) times daily with a meal.     aspirin 325 MG tablet  Take 325 mg by mouth daily.     fexofenadine 180 MG tablet  Commonly known as:  ALLEGRA  Take 180 mg by mouth daily.     hydrochlorothiazide 25 MG tablet  Commonly known as:  HYDRODIURIL  Take 25 mg by mouth every morning.     losartan 50 MG tablet  Commonly known as:  COZAAR  Take 50 mg by mouth every morning.     multivitamin with minerals Tabs tablet  Take 1 tablet by mouth daily.     Potassium 99 MG Tabs  Take 1 tablet by mouth every morning.     pseudoephedrine 30 MG tablet  Commonly known as:  SUDAFED  Take 30 mg by mouth daily as needed for congestion.       Allergies  Allergen Reactions  . Indocin [Indomethacin] Other (See Comments)    Reaction: migraine headaches  . Prednisone Other (See Comments)    Reaction:Leg cramps       Follow-up Information   Follow up with Colette Ribas, MD. Schedule an appointment as soon as possible for a visit in 2 weeks. (Need to followup on the right kidney mass .)    Specialty:  Family Medicine   Contact information:   [address] [place] [address] [phone]        The results of significant diagnostics from this hospitalization (including imaging, microbiology, ancillary and laboratory) are listed below for reference.    Significant Diagnostic Studies: Ct Abdomen Pelvis W Contrast  04/10/2013   CLINICAL DATA:  Mid to lower abdominal pain. Abdominal swelling and tenderness. Status post hysterectomy. Question mass.  EXAM: CT ABDOMEN AND PELVIS WITH CONTRAST  TECHNIQUE: Multidetector CT imaging of the abdomen and pelvis was performed using the standard protocol following bolus administration of intravenous contrast.  CONTRAST:  50mL OMNIPAQUE IOHEXOL 300 MG/ML SOLN, OMNIPAQUE IOHEXOL 300 MG/ML SOLN  COMPARISON:   Abdominal ultrasound 10/23/2011. CT of the abdomen without and with contrast 12/17/2003.  FINDINGS: Mild dependent atelectasis is present at the lung bases bilaterally. And no focal nodule, mass, or significant airspace consolidation is present. The heart size is normal. No significant pleural or pericardial effusion is present.  Diffuse fatty infiltration of the liver is again noted. No focal lesions are evident. The spleen is within normal limits. A small hiatal hernia is present. The stomach, duodenum, and pancreas are unremarkable. The common bile duct and gallbladder are within normal limits. The adrenal glands are normal bilaterally.  A 15 mm exophytic lesion at the upper pole of the right kidney is new. And this lesion measures solid density. The left kidney is unremarkable. The ureters are normal bilaterally. The urinary bladder is unremarkable. No significant adenopathy or free fluid is present.  Diverticular changes are present within the rectosigmoid colon. No focal inflammatory changes evident to suggest diverticulitis. At the more proximal colon is within normal limits. The small bowel is unremarkable.  Bone windows demonstrate moderate facet hypertrophy at L5-S1. No focal lytic or blastic lesions are evident.  IMPRESSION: 1. New 1.5 cm exophytic solid  mass lesion at the upper pole of the right kidney. Neoplasm is not excluded. 2. No acute or focal lesion otherwise to explain the patient's symptoms. And 3. Stable diffuse fatty infiltration of the liver.   Electronically Signed   By: Gennette Pac M.D.   On: 04/10/2013 13:19   Dg Chest Portable 1 View  04/10/2013   CLINICAL DATA:  Shortness of breath and chest pain.  And  EXAM: PORTABLE CHEST - 1 VIEW  COMPARISON:  Chest x-ray 11/09/2011.  FINDINGS: The heart size is mildly enlarged. Mild pulmonary vascular congestion is present. No focal airspace consolidation is evident. The visualized soft tissues and bony thorax are unremarkable.  IMPRESSION: 1.  Borderline cardiomegaly and mild pulmonary vascular congestion.   Electronically Signed   By: Gennette Pac M.D.   On: 04/10/2013 10:28        Labs: Basic Metabolic Panel:  Recent Labs Lab 04/10/13 1027 04/11/13 0611  NA 139 137  K 4.0 3.7  CL 102 98  CO2 24 28  GLUCOSE 113* 120*  BUN 19 14  CREATININE 0.65 0.73  CALCIUM 10.5 10.5   Liver Function Tests:  Recent Labs Lab 04/10/13 1027 04/11/13 0611  AST 58* 40*  ALT 58* 45*  ALKPHOS 64 58  BILITOT 0.3 0.5  PROT 7.2 6.7  ALBUMIN 3.9 3.6     CBC:  Recent Labs Lab 04/10/13 1027  WBC 5.6  NEUTROABS 3.5  HGB 13.9  HCT 40.1  MCV 87.7  PLT 194   Cardiac Enzymes:  Recent Labs Lab 04/10/13 1027 04/10/13 1241 04/10/13 1835 04/11/13 0103  TROPONINI <0.30 <0.30 <0.30 <0.30   BNP: BNP (last 3 results)  Recent Labs  04/10/13 1027  PROBNP 171.3*   CBG: No results found for this basename: GLUCAP,  in the last 168 hours     Signed:  GOSRANI,NIMISH C  [facility] 04/11/2013, 8:34 AM

## 2013-04-12 NOTE — Progress Notes (Signed)
Patient received discharge instructions along with follow up appointments. Patient was not given any new medications and was instructed to continue with regular medications. Patient verbalized understanding of all instructions. Patient was escorted by staff via wheelchair to vehicle. Patient discharged to home in stable condition.

## 2013-04-13 NOTE — Progress Notes (Signed)
UR Chart Review Completed  

## 2013-12-14 ENCOUNTER — Other Ambulatory Visit (HOSPITAL_COMMUNITY): Payer: Self-pay | Admitting: Family Medicine

## 2013-12-14 ENCOUNTER — Ambulatory Visit (HOSPITAL_COMMUNITY)
Admission: RE | Admit: 2013-12-14 | Discharge: 2013-12-14 | Disposition: A | Discharge status: Home / Self Care | Payer: BC Managed Care – PPO | Source: Ambulatory Visit | Attending: Family Medicine | Admitting: Family Medicine

## 2013-12-14 DIAGNOSIS — S022XXA Fracture of nasal bones, initial encounter for closed fracture: Secondary | ICD-10-CM | POA: Insufficient documentation

## 2013-12-14 DIAGNOSIS — W1809XA Striking against other object with subsequent fall, initial encounter: Secondary | ICD-10-CM | POA: Insufficient documentation

## 2013-12-14 DIAGNOSIS — S0010XA Contusion of unspecified eyelid and periocular area, initial encounter: Secondary | ICD-10-CM

## 2013-12-14 DIAGNOSIS — W19XXXA Unspecified fall, initial encounter: Secondary | ICD-10-CM

## 2013-12-14 DIAGNOSIS — R51 Headache: Secondary | ICD-10-CM | POA: Insufficient documentation

## 2013-12-14 DIAGNOSIS — Y92009 Unspecified place in unspecified non-institutional (private) residence as the place of occurrence of the external cause: Secondary | ICD-10-CM | POA: Insufficient documentation

## 2014-01-07 ENCOUNTER — Ambulatory Visit (INDEPENDENT_AMBULATORY_CARE_PROVIDER_SITE_OTHER): Payer: BC Managed Care – PPO | Admitting: Internal Medicine

## 2014-01-07 ENCOUNTER — Encounter: Payer: Self-pay | Admitting: Internal Medicine

## 2014-01-07 VITALS — BP 158/98 | HR 67 | Ht 65.0 in | Wt 244.1 lb

## 2014-01-07 DIAGNOSIS — R0989 Other specified symptoms and signs involving the circulatory and respiratory systems: Secondary | ICD-10-CM

## 2014-01-07 DIAGNOSIS — I10 Essential (primary) hypertension: Secondary | ICD-10-CM

## 2014-01-07 DIAGNOSIS — R0609 Other forms of dyspnea: Secondary | ICD-10-CM

## 2014-01-07 DIAGNOSIS — R0789 Other chest pain: Secondary | ICD-10-CM

## 2014-01-07 DIAGNOSIS — R0600 Dyspnea, unspecified: Secondary | ICD-10-CM

## 2014-01-07 DIAGNOSIS — F1010 Alcohol abuse, uncomplicated: Secondary | ICD-10-CM

## 2014-01-07 DIAGNOSIS — R079 Chest pain, unspecified: Secondary | ICD-10-CM | POA: Insufficient documentation

## 2014-01-07 NOTE — Patient Instructions (Signed)
Your physician recommends that you schedule a follow-up appointment as needed  

## 2014-01-07 NOTE — Progress Notes (Signed)
OFFICE NOTE  Chief Complaint:  Dyspnea, atypical chest pain  Primary Care Physician: Trinna Post, MD  HPI:  Emma Williams is a 58 year old female kindly referred to me for evaluation of shortness of breath and intermittent episodes of chest pain. She was hospitalized in October underwent testing including an echocardiogram which showed normal systolic function, mild diastolic dysfunction, and some mild aortic sclerosis with an "uncertain number of aortic valve leaflets". She reportedly has had a murmur for a number of years. Unfortunately, she also has an alcohol abuse history. She works as a Surveyor, mining, but she says that she has control over her alcohol use and does not affect her job. Recently she's been having episodes of shortness of breath and intermittent episodes of chest pain. She says that she went on a walking trip at [employer] and the trail was more difficult than she had expected. During that walk she became fairly short of breath and had to stop multiple times. She occasionally gets left-sided sharp chest discomfort and mild heaviness which is worse when she is short of breath. She is also morbidly obese with a BMI of 40. There is a family history of heart disease with a mother who had high blood pressure diabetes and a stroke and father with congestive heart failure.  PMHx:  Past Medical History  Diagnosis Date  . Chronic back pain   . Hypertension   . Arthritis     Past Surgical History  Procedure Laterality Date  . Abdominal hysterectomy  1994  . Appendectomy      FAMHx:  Family History  Problem Relation Age of Onset  . Hypertension Mother   . Diabetes Mother   . Stroke Mother 14  . Heart failure Father   . Diabetes Maternal Grandmother     SOCHx:   reports that she has never smoked. She has never used smokeless tobacco. She reports that she drinks alcohol. She reports that she does not use illicit drugs.  ALLERGIES:    Allergies  Allergen Reactions  . Indocin [Indomethacin] Other (See Comments)    Reaction: migraine headaches  . Prednisone Other (See Comments)    Reaction:Leg cramps - problem with being abruptly taken off medication    ROS: A comprehensive review of systems was negative except for: Respiratory: positive for dyspnea on exertion Cardiovascular: positive for chest pain  HOME MEDS: Current Outpatient Prescriptions  Medication Sig Dispense Refill  . aspirin 325 MG tablet Take 325 mg by mouth daily.      . fexofenadine (ALLEGRA) 180 MG tablet Take 180 mg by mouth daily.      . hydrochlorothiazide (HYDRODIURIL) 25 MG tablet Take 50 mg by mouth daily.       Marland Kitchen losartan (COZAAR) 50 MG tablet Take 50 mg by mouth every morning.      . Multiple Vitamin (MULITIVITAMIN WITH MINERALS) TABS Take 1 tablet by mouth daily.       . naproxen sodium (ALEVE) 220 MG tablet Take 220 mg by mouth 2 (two) times daily with a meal.      . Potassium Gluconate 595 MG CAPS Take 2 capsules by mouth daily.      . pseudoephedrine (SUDAFED) 30 MG tablet Take 30 mg by mouth daily as needed for congestion.       No current facility-administered medications for this visit.    LABS/IMAGING: No results found for this or any previous visit (from the past 48 hour(s)). No results  found.  VITALS: BP 158/98  Pulse 67  Ht 5\' 5"  (1.651 m)  Wt 244 lb 1.6 oz (110.723 kg)  BMI 40.62 kg/m2  EXAM: General appearance: alert, no distress and morbidly obese Neck: no carotid bruit and no JVD Lungs: clear to auscultation bilaterally Heart: regular rate and rhythm, S1, S2 normal, no murmur, click, rub or gallop Abdomen: soft, non-tender; bowel sounds normal; no masses,  no organomegaly Extremities: extremities normal, atraumatic, no cyanosis or edema Pulses: 2+ and symmetric Skin: Skin color, texture, turgor normal. No rashes or lesions Neurologic: Grossly normal Psych: Mood, affect normal  EKG: Normal sinus rhythm at  67  ASSESSMENT: 1. Dyspnea and exertion 2. Atypical chest pain 3. Morbid obesity 4. Hypertension 5. Etoh abus 6. Renal mass  PLAN: 1.   Emma Williams is describing worsening shortness of breath with exertion and some chest pain which sounds atypical. She does have risk factors including morbid obesity, hypertension a strong family history of heart disease. I recommended additional stress testing, however she felt that she did not need that at this time. She was concerned about the cost of additional stress testing. She wishes to work on other risk factors and may contact me if she is interested in stress testing in the future. We also discussed an abnormal CT scan finding of a mass in the right kidney. It was recommended that she be referred to a urologist however she never made that appointment. I reiterated the fact today that that may be renal cancer and she should have it evaluated. Finally we did discuss her ongoing alcohol use and the importance of stopping that especially given the fact that her CT scan showed steatohepatitis.  Thank you for the kind referral. Should she be interested in further workup in the future I'm happy to see her back.  Chestine Spore, MD, [facility] Attending Cardiologist [facility]  HILTY,Kenneth C 01/07/2014, 3:32 PM

## 2014-10-20 ENCOUNTER — Other Ambulatory Visit (HOSPITAL_COMMUNITY): Payer: Self-pay | Admitting: Family Medicine

## 2014-10-20 DIAGNOSIS — M5410 Radiculopathy, site unspecified: Secondary | ICD-10-CM

## 2014-10-26 ENCOUNTER — Ambulatory Visit (HOSPITAL_COMMUNITY)
Admission: RE | Admit: 2014-10-26 | Discharge: 2014-10-26 | Disposition: A | Discharge status: Home / Self Care | Payer: BC Managed Care – PPO | Source: Ambulatory Visit | Attending: Family Medicine | Admitting: Family Medicine

## 2014-10-26 DIAGNOSIS — M5410 Radiculopathy, site unspecified: Secondary | ICD-10-CM | POA: Diagnosis present

## 2020-03-11 DIAGNOSIS — Z6841 Body Mass Index (BMI) 40.0 and over, adult: Secondary | ICD-10-CM | POA: Diagnosis not present

## 2020-03-11 DIAGNOSIS — R7309 Other abnormal glucose: Secondary | ICD-10-CM | POA: Diagnosis not present

## 2020-03-11 DIAGNOSIS — N342 Other urethritis: Secondary | ICD-10-CM | POA: Diagnosis not present

## 2020-03-15 DIAGNOSIS — H43811 Vitreous degeneration, right eye: Secondary | ICD-10-CM | POA: Diagnosis not present

## 2020-04-28 DIAGNOSIS — Z6841 Body Mass Index (BMI) 40.0 and over, adult: Secondary | ICD-10-CM | POA: Diagnosis not present

## 2020-04-28 DIAGNOSIS — R82998 Other abnormal findings in urine: Secondary | ICD-10-CM | POA: Diagnosis not present

## 2020-04-28 DIAGNOSIS — M25475 Effusion, left foot: Secondary | ICD-10-CM | POA: Diagnosis not present

## 2020-04-28 DIAGNOSIS — Z1331 Encounter for screening for depression: Secondary | ICD-10-CM | POA: Diagnosis not present

## 2020-04-28 DIAGNOSIS — Z0000 Encounter for general adult medical examination without abnormal findings: Secondary | ICD-10-CM | POA: Diagnosis not present

## 2020-05-10 DIAGNOSIS — Z0000 Encounter for general adult medical examination without abnormal findings: Secondary | ICD-10-CM | POA: Diagnosis not present

## 2020-05-10 DIAGNOSIS — Z1389 Encounter for screening for other disorder: Secondary | ICD-10-CM | POA: Diagnosis not present

## 2020-05-10 DIAGNOSIS — M25475 Effusion, left foot: Secondary | ICD-10-CM | POA: Diagnosis not present

## 2020-05-10 DIAGNOSIS — R82998 Other abnormal findings in urine: Secondary | ICD-10-CM | POA: Diagnosis not present

## 2020-10-27 ENCOUNTER — Other Ambulatory Visit (HOSPITAL_COMMUNITY): Payer: Self-pay | Admitting: Family Medicine

## 2020-10-27 ENCOUNTER — Other Ambulatory Visit: Payer: Self-pay

## 2020-10-27 ENCOUNTER — Ambulatory Visit (HOSPITAL_COMMUNITY)
Admission: RE | Admit: 2020-10-27 | Discharge: 2020-10-27 | Disposition: A | Discharge status: Home / Self Care | Payer: Medicare PPO | Source: Ambulatory Visit | Attending: Family Medicine | Admitting: Family Medicine

## 2020-10-27 DIAGNOSIS — M542 Cervicalgia: Secondary | ICD-10-CM

## 2020-10-27 DIAGNOSIS — R7301 Impaired fasting glucose: Secondary | ICD-10-CM | POA: Diagnosis not present

## 2020-10-27 DIAGNOSIS — Z6841 Body Mass Index (BMI) 40.0 and over, adult: Secondary | ICD-10-CM | POA: Diagnosis not present

## 2020-10-27 DIAGNOSIS — Z1331 Encounter for screening for depression: Secondary | ICD-10-CM | POA: Diagnosis not present

## 2020-10-27 DIAGNOSIS — Z043 Encounter for examination and observation following other accident: Secondary | ICD-10-CM | POA: Diagnosis not present

## 2020-10-27 DIAGNOSIS — I10 Essential (primary) hypertension: Secondary | ICD-10-CM | POA: Diagnosis not present

## 2020-10-28 DIAGNOSIS — Z1389 Encounter for screening for other disorder: Secondary | ICD-10-CM | POA: Diagnosis not present

## 2020-10-28 DIAGNOSIS — Z6841 Body Mass Index (BMI) 40.0 and over, adult: Secondary | ICD-10-CM | POA: Diagnosis not present

## 2020-10-28 DIAGNOSIS — E782 Mixed hyperlipidemia: Secondary | ICD-10-CM | POA: Diagnosis not present

## 2021-03-16 DIAGNOSIS — H52223 Regular astigmatism, bilateral: Secondary | ICD-10-CM | POA: Diagnosis not present

## 2021-03-16 DIAGNOSIS — H5203 Hypermetropia, bilateral: Secondary | ICD-10-CM | POA: Diagnosis not present

## 2021-03-16 DIAGNOSIS — H43393 Other vitreous opacities, bilateral: Secondary | ICD-10-CM | POA: Diagnosis not present

## 2021-04-27 ENCOUNTER — Other Ambulatory Visit (HOSPITAL_COMMUNITY): Payer: Self-pay | Admitting: Family Medicine

## 2021-04-27 ENCOUNTER — Other Ambulatory Visit (HOSPITAL_COMMUNITY): Payer: Self-pay | Admitting: Internal Medicine

## 2021-04-27 DIAGNOSIS — Z0001 Encounter for general adult medical examination with abnormal findings: Secondary | ICD-10-CM | POA: Diagnosis not present

## 2021-04-27 DIAGNOSIS — I10 Essential (primary) hypertension: Secondary | ICD-10-CM | POA: Diagnosis not present

## 2021-04-27 DIAGNOSIS — R7301 Impaired fasting glucose: Secondary | ICD-10-CM | POA: Diagnosis not present

## 2021-04-27 DIAGNOSIS — Z1331 Encounter for screening for depression: Secondary | ICD-10-CM | POA: Diagnosis not present

## 2021-04-27 DIAGNOSIS — E782 Mixed hyperlipidemia: Secondary | ICD-10-CM | POA: Diagnosis not present

## 2021-04-27 DIAGNOSIS — Z6838 Body mass index (BMI) 38.0-38.9, adult: Secondary | ICD-10-CM | POA: Diagnosis not present

## 2021-04-27 DIAGNOSIS — M542 Cervicalgia: Secondary | ICD-10-CM

## 2021-04-28 ENCOUNTER — Other Ambulatory Visit: Payer: Self-pay

## 2021-04-28 ENCOUNTER — Ambulatory Visit (HOSPITAL_COMMUNITY)
Admission: RE | Admit: 2021-04-28 | Discharge: 2021-04-28 | Disposition: A | Discharge status: Home / Self Care | Payer: Medicare PPO | Source: Ambulatory Visit | Attending: Family Medicine | Admitting: Family Medicine

## 2021-04-28 DIAGNOSIS — M542 Cervicalgia: Secondary | ICD-10-CM | POA: Diagnosis not present

## 2021-04-28 DIAGNOSIS — R519 Headache, unspecified: Secondary | ICD-10-CM | POA: Diagnosis not present

## 2021-05-01 ENCOUNTER — Other Ambulatory Visit (HOSPITAL_COMMUNITY): Payer: Self-pay | Admitting: Family Medicine

## 2021-05-01 DIAGNOSIS — Z13820 Encounter for screening for osteoporosis: Secondary | ICD-10-CM

## 2021-05-16 DIAGNOSIS — M2560 Stiffness of unspecified joint, not elsewhere classified: Secondary | ICD-10-CM | POA: Diagnosis not present

## 2021-05-16 DIAGNOSIS — R531 Weakness: Secondary | ICD-10-CM | POA: Diagnosis not present

## 2021-05-16 DIAGNOSIS — R293 Abnormal posture: Secondary | ICD-10-CM | POA: Diagnosis not present

## 2021-05-16 DIAGNOSIS — M542 Cervicalgia: Secondary | ICD-10-CM | POA: Diagnosis not present

## 2021-05-18 DIAGNOSIS — R531 Weakness: Secondary | ICD-10-CM | POA: Diagnosis not present

## 2021-05-18 DIAGNOSIS — R293 Abnormal posture: Secondary | ICD-10-CM | POA: Diagnosis not present

## 2021-05-18 DIAGNOSIS — M2560 Stiffness of unspecified joint, not elsewhere classified: Secondary | ICD-10-CM | POA: Diagnosis not present

## 2021-05-18 DIAGNOSIS — M542 Cervicalgia: Secondary | ICD-10-CM | POA: Diagnosis not present

## 2021-05-23 DIAGNOSIS — R531 Weakness: Secondary | ICD-10-CM | POA: Diagnosis not present

## 2021-05-23 DIAGNOSIS — R293 Abnormal posture: Secondary | ICD-10-CM | POA: Diagnosis not present

## 2021-05-23 DIAGNOSIS — M542 Cervicalgia: Secondary | ICD-10-CM | POA: Diagnosis not present

## 2021-05-23 DIAGNOSIS — M2560 Stiffness of unspecified joint, not elsewhere classified: Secondary | ICD-10-CM | POA: Diagnosis not present

## 2021-05-25 DIAGNOSIS — R293 Abnormal posture: Secondary | ICD-10-CM | POA: Diagnosis not present

## 2021-05-25 DIAGNOSIS — M542 Cervicalgia: Secondary | ICD-10-CM | POA: Diagnosis not present

## 2021-05-25 DIAGNOSIS — M2560 Stiffness of unspecified joint, not elsewhere classified: Secondary | ICD-10-CM | POA: Diagnosis not present

## 2021-05-25 DIAGNOSIS — R531 Weakness: Secondary | ICD-10-CM | POA: Diagnosis not present

## 2021-05-30 DIAGNOSIS — M2560 Stiffness of unspecified joint, not elsewhere classified: Secondary | ICD-10-CM | POA: Diagnosis not present

## 2021-05-30 DIAGNOSIS — R531 Weakness: Secondary | ICD-10-CM | POA: Diagnosis not present

## 2021-05-30 DIAGNOSIS — R293 Abnormal posture: Secondary | ICD-10-CM | POA: Diagnosis not present

## 2021-05-30 DIAGNOSIS — M542 Cervicalgia: Secondary | ICD-10-CM | POA: Diagnosis not present

## 2021-08-17 DIAGNOSIS — E7849 Other hyperlipidemia: Secondary | ICD-10-CM | POA: Diagnosis not present

## 2021-09-12 ENCOUNTER — Ambulatory Visit: Payer: Medicare PPO | Admitting: "Endocrinology

## 2021-09-12 ENCOUNTER — Telehealth: Payer: Self-pay | Admitting: "Endocrinology

## 2021-09-12 ENCOUNTER — Other Ambulatory Visit: Payer: Self-pay

## 2021-09-12 ENCOUNTER — Encounter: Payer: Self-pay | Admitting: "Endocrinology

## 2021-09-12 NOTE — Telephone Encounter (Signed)
Pt needs to be scheduled for bone density. ?

## 2021-09-12 NOTE — Progress Notes (Signed)
Endocrinology Consult Note       09/12/2021, 2:00 PM  Emma Williams is a 66 y.o.-year-old female, referred by her  [name]  , for evaluation for hypercalcemia/hyperparathyroidism.   Past Medical History:  Diagnosis Date   Arthritis    Chronic back pain    Hyperparathyroidism (HCC)    Hypertension     Past Surgical History:  Procedure Laterality Date   ABDOMINAL HYSTERECTOMY  1994   APPENDECTOMY      Social History   Tobacco Use   Smoking status: Never   Smokeless tobacco: Never  Vaping Use   Vaping Use: Never used  Substance Use Topics   Alcohol use: Not Currently    Comment: liquor daily "several drinks" - "don't drink every day" (01/07/14)   Drug use: No    Family History  Problem Relation Age of Onset   Hypertension Mother    Diabetes Mother    Stroke Mother 86   Heart failure Father    Diabetes Maternal Grandmother     Outpatient Encounter Medications as of 09/12/2021  Medication Sig   aspirin 81 MG EC tablet Take 81 mg by mouth daily. Swallow whole.   Magnesium 400 MG CAPS Take 1 tablet by mouth daily.   Multiple Vitamins-Minerals (OCUVITE PO) Take 1 tablet by mouth 2 (two) times a week.   fexofenadine (ALLEGRA) 180 MG tablet Take 180 mg by mouth daily.   hydrochlorothiazide (HYDRODIURIL) 25 MG tablet Take 50 mg by mouth daily.    losartan (COZAAR) 25 MG tablet Take 25 mg by mouth daily.   meloxicam (MOBIC) 15 MG tablet Take 15 mg by mouth daily.   Multiple Vitamin (MULITIVITAMIN WITH MINERALS) TABS Take 1 tablet by mouth 3 (three) times a week.   Potassium Gluconate 595 MG CAPS Take 2 capsules by mouth daily.   traMADol (ULTRAM) 50 MG tablet Take 50 mg by mouth every 4 (four) hours as needed.   [DISCONTINUED] aspirin 325 MG tablet Take 325 mg by mouth daily.   [DISCONTINUED] losartan (COZAAR) 50 MG tablet Take 50 mg by mouth every morning.   [DISCONTINUED] naproxen sodium (ALEVE) 220  MG tablet Take 220 mg by mouth 2 (two) times daily with a meal.   [DISCONTINUED] pseudoephedrine (SUDAFED) 30 MG tablet Take 30 mg by mouth daily as needed for congestion.   No facility-administered encounter medications on file as of 09/12/2021.    Allergies  Allergen Reactions   Indocin [Indomethacin] Other (See Comments)    Reaction: migraine headaches     HPI  Emma Williams reports that she was told she has high calcium for at least the last 5 years.   -Patient does not recall if she was told she has parathyroid dysfunction.  She denies any past history of thyroid, adrenal, pituitary dysfunctions.     -Review of herreferral package of most recent labs reveals calcium of 11.2 with the corresponding PTH of 56 .   -She does not have any record of bone density.  No prior history of fragility fractures or falls. No history of  kidney stones.  No history of CKD.  she is  on HCTZ for blood pressure treatment.  She is on multiple vitamin supplements.  she is not on calcium supplements,  she eats dairy and green, leafy, vegetables on average amounts.  she does not have a family history of hypercalcemia, pituitary tumors, thyroid cancer, or osteoporosis.  -She underwent ovarian sparing hysterectomy in 1994.  I reviewed her chart and she also has a history of hypertension on treatment.   ROS:  Constitutional: + Fluctuating body weight,  no fatigue, no subjective hyperthermia, no subjective hypothermia Eyes: no blurry vision, no xerophthalmia ENT: no sore throat, no nodules palpated in throat, no dysphagia/odynophagia, no hoarseness Cardiovascular: no Chest Pain, no Shortness of Breath, no palpitations, no leg swelling Respiratory: no cough, no shortness of breath  Gastrointestinal: no Nausea/Vomiting/Diarhhea Musculoskeletal: no muscle/joint aches Skin: no rashes Neurological: no tremors, no numbness, no tingling, no dizziness Psychiatric: no depression, no anxiety  PE: BP  138/80    Pulse 64    Ht 5' 3.5" (1.613 m)    Wt 221 lb 9.6 oz (100.5 kg)    BMI 38.64 kg/m , Body mass index is 38.64 kg/m. Wt Readings from Last 3 Encounters:  09/12/21 221 lb 9.6 oz (100.5 kg)  10/26/14 245 lb (111.1 kg)  01/07/14 244 lb 1.6 oz (110.7 kg)    Constitutional: + BMI of 38.6, not in acute distress, normal state of mind Eyes: PERRLA, EOMI, no exophthalmos ENT: moist mucous membranes, no gross thyromegaly, no gross cervical lymphadenopathy Cardiovascular: normal precordial activity, Regular Rate and Rhythm, no Murmur/Rubs/Gallops Respiratory:  adequate breathing efforts, no gross chest deformity, Clear to auscultation bilaterally Gastrointestinal: abdomen soft, Non -tender, No distension, Bowel Sounds present Musculoskeletal: no gross deformities, strength intact in all four extremities Skin: moist, warm, no rashes Neurological: no tremor with outstretched hands, Deep tendon reflexes normal in bilateral lower extremities.     CMP ( most recent) CMP     Component Value Date/Time   NA 137 04/11/2013 0611   K 3.7 04/11/2013 0611   CL 98 04/11/2013 0611   CO2 28 04/11/2013 0611   GLUCOSE 120 (H) 04/11/2013 0611   BUN 14 04/11/2013 0611   CREATININE 0.73 04/11/2013 0611   CALCIUM 10.5 04/11/2013 0611   PROT 6.7 04/11/2013 0611   ALBUMIN 3.6 04/11/2013 0611   AST 40 (H) 04/11/2013 0611   ALT 45 (H) 04/11/2013 0611   ALKPHOS 58 04/11/2013 0611   BILITOT 0.5 04/11/2013 0611   GFRNONAA >90 04/11/2013 0611   GFRAA >90 04/11/2013 0611     Diabetic Labs (most recent): Lab Results  Component Value Date   HGBA1C 5.6 04/10/2013      Lab Results  Component Value Date   TSH 1.066 04/10/2013     Assessment: 1. Hypercalcemia / Hyperparathyroidism  Plan: Patient has had several instances of elevated calcium, with the highest level being at 11.2 mg/dL. A corresponding intact PTH level was also unsuppressed at 56.   -Her vitamin D status is not known.  She is on  multivitamin supplements.   She does not have any recorded bone density, however no apparent complications from hypercalcemia/hyperparathyroidism: no history of  nephrolithiasis,  osteoporosis,fragility fractures. No abdominal pain, no major mood disorders, no bone pain.  - I discussed with the patient about the physiology of calcium and parathyroid hormone, and possible  effects of  increased PTH/ Calcium , including kidney stones, cardiac dysrhythmias, osteoporosis, abdominal pain, etc.   - The work up so far is not sufficient to reach a  conclusion for definitive therapy.   She will need 24-hour urine calcium measurement and bone density.  She has reluctantly accepted the recommendation  for bone density study. It is also essential to obtain 24-hour urine calcium/creatinine to rule out the rare but important cause of mild elevation in calcium and PTH- FHH ( Familial Hypocalciuric Hypercalcemia), which may not require any active intervention.  - I will request for her next DEXA scan to include the distal  33% of  radius for evaluation of cortical bone, which is predominantly affected by hyperparathyroidism.   She will return in 2 weeks with her results and will be considered for treatment options if necessary.  Her current treatment with hydrochlorothiazide 25 mg p.o. twice daily potentially can explain mild hypercalcemia.  If her 24-hour urine calcium is not elevated, she will be considered off of hydrochlorothiazide. She is advised to maintain close follow-up with her PCP.  - Time spent with the patient: 60 minutes, of which >50% was spent in obtaining information about her symptoms, reviewing her previous labs, evaluations, and treatments, counseling her about her hypercalcemia, and developing a plan to confirm the diagnosis and long term treatment as necessary.  Please refer to " Patient Self Inventory" in the Media  tab for reviewed elements of pertinent patient history.  Emma DoomsJoan P Williams  participated in the discussions, expressed understanding, and voiced agreement with the above plans.  All questions were answered to her satisfaction. she is encouraged to contact clinic should she have any questions or concerns prior to her return visit.  - Return in about 2 weeks (around 09/26/2021) for DXA Scan B4 NV, 24 Hr Urine Ca & Cr.   Marquis LunchGebre Chavy Avera, MD [facility] [address] [place], [place] [address] Phone: [phone]  Fax: [phone]    This note was partially dictated with voice recognition software. Similar sounding words can be transcribed inadequately or may not  be corrected upon review.  09/12/2021, 2:00 PM

## 2021-09-13 NOTE — Telephone Encounter (Signed)
Scheduled for 09-18-21 at 10:00. Pt notified ?

## 2021-09-16 LAB — CREATININE, URINE, 24 HOUR
Creatinine, 24H Ur: 1292 mg/24 hr (ref 800–1800)
Creatinine, Urine: 68 mg/dL

## 2021-09-16 LAB — CALCIUM, URINE, 24 HOUR
Calcium, 24H Urine: 131 mg/24 hr (ref 0–320)
Calcium, Urine: 6.9 mg/dL

## 2021-09-18 ENCOUNTER — Other Ambulatory Visit (HOSPITAL_COMMUNITY): Payer: Medicare PPO

## 2021-09-20 ENCOUNTER — Ambulatory Visit (HOSPITAL_COMMUNITY)
Admission: RE | Admit: 2021-09-20 | Discharge: 2021-09-20 | Disposition: A | Discharge status: Home / Self Care | Payer: Medicare PPO | Source: Ambulatory Visit | Attending: "Endocrinology | Admitting: "Endocrinology

## 2021-09-20 ENCOUNTER — Other Ambulatory Visit: Payer: Self-pay

## 2021-09-20 DIAGNOSIS — Z13820 Encounter for screening for osteoporosis: Secondary | ICD-10-CM | POA: Diagnosis not present

## 2021-09-20 DIAGNOSIS — M069 Rheumatoid arthritis, unspecified: Secondary | ICD-10-CM | POA: Insufficient documentation

## 2021-09-20 DIAGNOSIS — M85852 Other specified disorders of bone density and structure, left thigh: Secondary | ICD-10-CM | POA: Insufficient documentation

## 2021-09-20 DIAGNOSIS — Z780 Asymptomatic menopausal state: Secondary | ICD-10-CM | POA: Diagnosis not present

## 2021-09-26 ENCOUNTER — Encounter: Payer: Self-pay | Admitting: "Endocrinology

## 2021-09-26 ENCOUNTER — Other Ambulatory Visit: Payer: Self-pay

## 2021-09-26 ENCOUNTER — Ambulatory Visit: Payer: Medicare PPO | Admitting: "Endocrinology

## 2021-09-26 NOTE — Progress Notes (Signed)
?                                                           ?     09/26/2021, 4:58 PM ? ?Endocrinology follow-up note ? ?Emma Williams is a 66 y.o.-year-old female, referred by her  Emma Williams  , for evaluation for hypercalcemia/hyperparathyroidism. ? ? ?Past Medical History:  ?Diagnosis Date  ? Arthritis   ? Chronic back pain   ? Hyperparathyroidism (HCC)   ? Hypertension   ? ? ?Past Surgical History:  ?Procedure Laterality Date  ? ABDOMINAL HYSTERECTOMY  1994  ? APPENDECTOMY    ? ? ?Social History  ? ?Tobacco Use  ? Smoking status: Never  ? Smokeless tobacco: Never  ?Vaping Use  ? Vaping Use: Never used  ?Substance Use Topics  ? Alcohol use: Not Currently  ?  Comment: liquor daily "several drinks" - "don't drink every day" (01/07/14)  ? Drug use: No  ? ? ?Family History  ?Problem Relation Age of Onset  ? Hypertension Mother   ? Diabetes Mother   ? Stroke Mother 10  ? Heart failure Father   ? Diabetes Maternal Grandmother   ? ? ?Outpatient Encounter Medications as of 09/26/2021  ?Medication Sig  ? aspirin 81 MG EC tablet Take 81 mg by mouth daily. Swallow whole.  ? fexofenadine (ALLEGRA) 180 MG tablet Take 180 mg by mouth daily.  ? hydrochlorothiazide (HYDRODIURIL) 25 MG tablet Take 50 mg by mouth daily.   ? losartan (COZAAR) 25 MG tablet Take 25 mg by mouth daily.  ? Magnesium 400 MG CAPS Take 1 tablet by mouth daily.  ? meloxicam (MOBIC) 15 MG tablet Take 15 mg by mouth daily.  ? Multiple Vitamins-Minerals (OCUVITE PO) Take 1 tablet by mouth 2 (two) times a week.  ? Potassium Gluconate 595 MG CAPS Take 2 capsules by mouth daily.  ? traMADol (ULTRAM) 50 MG tablet Take 50 mg by mouth every 4 (four) hours as needed.  ? [DISCONTINUED] Multiple Vitamin (MULITIVITAMIN WITH MINERALS) TABS Take 1 tablet by mouth 3 (three) times a week.  ? ?No facility-administered encounter medications on file as of 09/26/2021.  ? ? ?Allergies  ?Allergen Reactions  ? Indocin [Indomethacin] Other (See Comments)  ?  Reaction:  migraine headaches  ? ? ? ?HPI  ?[name] returns to follow-up after she was seen in consultation for hypercalcemia.   ? ?She has had intermittent hypercalcemia for at least 5 years.   ?After her last visit, she was sent for 24-hour urine collection for calcium measurement.  This returned 24-hour urine calcium of 131 mg / 24 hours.  Her bone density revealed osteopenia of hips. She denies any past history of thyroid, adrenal, pituitary dysfunctions.   ? ? ?-Review of herreferral package of most recent labs reveals calcium of 11.2 with the corresponding PTH of 56 .  ? ?No prior history of fragility fractures or falls. No history of  kidney stones. ? ?No history of CKD.  ?she is  on HCTZ for blood pressure treatment.  She is on multiple vitamin supplements.  she is not on calcium supplements,  she eats dairy and green, leafy, vegetables on average amounts. ? ?she does not have a family history of hypercalcemia, pituitary tumors, thyroid cancer, or  osteoporosis.  ?-She underwent ovarian sparing hysterectomy in 1994. ? ?I reviewed her chart and she also has a history of hypertension on treatment. ? ? ?ROS: ? ?Constitutional: + Fluctuating body weight,  no fatigue, no subjective hyperthermia, no subjective hypothermia ?Eyes: no blurry vision, no xerophthalmia ?ENT: no sore throat, no nodules palpated in throat, no dysphagia/odynophagia, no hoarseness ?Cardiovascular: no Chest Pain, no Shortness of Breath, no palpitations, no leg swelling ?Respiratory: no cough, no shortness of breath  ?Gastrointestinal: no Nausea/Vomiting/Diarhhea ?Musculoskeletal: no muscle/joint aches ?Skin: no rashes ?Neurological: no tremors, no numbness, no tingling, no dizziness ?Psychiatric: no depression, no anxiety ? ?PE: ?BP 130/82   Pulse 72   Ht 5' 3.5" (1.613 m)   Wt 222 lb 6.4 oz (100.9 kg)   BMI 38.78 kg/m? , Body mass index is 38.78 kg/m?. ?Wt Readings from Last 3 Encounters:  ?09/26/21 222 lb 6.4 oz (100.9 kg)  ?09/12/21 221 lb  9.6 oz (100.5 kg)  ?10/26/14 245 lb (111.1 kg)  ?  ?Constitutional: + BMI of 38.6, not in acute distress, normal state of mind ? ? ?CMP ( most recent) ?CMP  ?   ?Component Value Date/Time  ? NA 137 04/11/2013 0611  ? K 3.7 04/11/2013 0611  ? CL 98 04/11/2013 0611  ? CO2 28 04/11/2013 0611  ? GLUCOSE 120 (H) 04/11/2013 16100611  ? BUN 14 04/11/2013 0611  ? CREATININE 0.73 04/11/2013 0611  ? CALCIUM 10.5 04/11/2013 0611  ? PROT 6.7 04/11/2013 0611  ? ALBUMIN 3.6 04/11/2013 0611  ? AST 40 (H) 04/11/2013 [id number]  ? ALT 45 (H) 04/11/2013 [id number]  ? ALKPHOS 58 04/11/2013 0611  ? BILITOT 0.5 04/11/2013 0611  ? GFRNONAA >90 04/11/2013 0611  ? GFRAA >90 04/11/2013 [id number]  ? ? ? ?Diabetic Labs (most recent): ?Lab Results  ?Component Value Date  ? HGBA1C 5.6 04/10/2013  ? ?  ? ?Lab Results  ?Component Value Date  ? TSH 1.066 04/10/2013  ?  ? ?Assessment: ?1. Hypercalcemia / Hyperparathyroidism ? ?Plan: ?Patient has had several instances of elevated calcium, with the highest level being at 11.2 mg/dL. A corresponding intact PTH level was also unsuppressed at 56.  ? ?-She is on multivitamin supplement, not taking any calcium supplements.   ? ?-Her recent bone density revealed osteopenia of hips.  There is no history of nephrolithiasis, no history of fragility fractures.  ?- No abdominal pain, no major mood disorders, no bone pain. ? ?- I discussed with the patient about the physiology of calcium and parathyroid hormone, and possible  effects of  increased PTH/ Calcium , including kidney stones, cardiac dysrhythmias, osteoporosis, abdominal pain, etc.  ? ?-Although it is likely that she has mild primary hyperparathyroidism, she will not be considered for surgery at this time.   ? ?She will return in 6 months with repeat PTH/calcium.  Her next bone density is due in 2 years.  She remains on large dose of hydrochlorothiazide, however hypercalcemia related to hydrochlorothiazide may not be associated with high PTH. ? ? ?She is advised to  maintain close follow-up with her PCP. ? ? ?I spent 31 minutes in the care of the patient today including review of labs from Thyroid Function, CMP, her bone density scan, and other relevant labs ; imaging/biopsy records (current and previous including abstractions from other facilities); face-to-face time discussing  her lab results and symptoms, medications doses, her options of short and long term treatment based on the latest standards of care / guidelines;  and documenting the encounter. ? ?[name]  participated in the discussions, expressed understanding, and voiced agreement with the above plans.  All questions were answered to her satisfaction. she is encouraged to contact clinic should she have any questions or concerns prior to her return visit. ? ? ?- Return in about 6 months (around 03/29/2022) for F/U with Pre-visit Labs. ? ? ?Marquis Lunch, MD ?[facility] ?[facility] ?[address] ?[place], [place] [address] ?Phone: [phone]  Fax: [phone]  ? ? ?This note was partially dictated with voice recognition software. Similar sounding words can be transcribed inadequately or may not  be corrected upon review. ? ?09/26/2021, 4:58 PM ?

## 2021-10-19 DIAGNOSIS — E6609 Other obesity due to excess calories: Secondary | ICD-10-CM | POA: Diagnosis not present

## 2021-10-19 DIAGNOSIS — D485 Neoplasm of uncertain behavior of skin: Secondary | ICD-10-CM | POA: Diagnosis not present

## 2021-10-19 DIAGNOSIS — N951 Menopausal and female climacteric states: Secondary | ICD-10-CM | POA: Diagnosis not present

## 2021-10-19 DIAGNOSIS — E782 Mixed hyperlipidemia: Secondary | ICD-10-CM | POA: Diagnosis not present

## 2021-10-19 DIAGNOSIS — Z6837 Body mass index (BMI) 37.0-37.9, adult: Secondary | ICD-10-CM | POA: Diagnosis not present

## 2021-10-19 DIAGNOSIS — I10 Essential (primary) hypertension: Secondary | ICD-10-CM | POA: Diagnosis not present

## 2021-10-24 DIAGNOSIS — N951 Menopausal and female climacteric states: Secondary | ICD-10-CM | POA: Diagnosis not present

## 2021-10-24 DIAGNOSIS — E7849 Other hyperlipidemia: Secondary | ICD-10-CM | POA: Diagnosis not present

## 2021-10-31 DIAGNOSIS — D751 Secondary polycythemia: Secondary | ICD-10-CM | POA: Diagnosis not present

## 2021-11-14 DIAGNOSIS — D225 Melanocytic nevi of trunk: Secondary | ICD-10-CM | POA: Diagnosis not present

## 2021-11-14 DIAGNOSIS — L821 Other seborrheic keratosis: Secondary | ICD-10-CM | POA: Diagnosis not present

## 2022-01-19 DIAGNOSIS — E6609 Other obesity due to excess calories: Secondary | ICD-10-CM | POA: Diagnosis not present

## 2022-01-19 DIAGNOSIS — M542 Cervicalgia: Secondary | ICD-10-CM | POA: Diagnosis not present

## 2022-01-19 DIAGNOSIS — Z6836 Body mass index (BMI) 36.0-36.9, adult: Secondary | ICD-10-CM | POA: Diagnosis not present

## 2022-01-19 DIAGNOSIS — R21 Rash and other nonspecific skin eruption: Secondary | ICD-10-CM | POA: Diagnosis not present

## 2022-01-19 DIAGNOSIS — E213 Hyperparathyroidism, unspecified: Secondary | ICD-10-CM | POA: Diagnosis not present

## 2022-03-02 ENCOUNTER — Other Ambulatory Visit (HOSPITAL_COMMUNITY): Payer: Self-pay | Admitting: Family Medicine

## 2022-03-02 ENCOUNTER — Other Ambulatory Visit: Payer: Self-pay | Admitting: Family Medicine

## 2022-03-02 DIAGNOSIS — R21 Rash and other nonspecific skin eruption: Secondary | ICD-10-CM

## 2022-03-02 DIAGNOSIS — R1030 Lower abdominal pain, unspecified: Secondary | ICD-10-CM

## 2022-03-14 ENCOUNTER — Ambulatory Visit (HOSPITAL_COMMUNITY)
Admission: RE | Admit: 2022-03-14 | Discharge: 2022-03-14 | Disposition: A | Discharge status: Home / Self Care | Payer: Medicare PPO | Source: Ambulatory Visit | Attending: Family Medicine | Admitting: Family Medicine

## 2022-03-14 ENCOUNTER — Encounter (HOSPITAL_COMMUNITY): Payer: Self-pay

## 2022-03-14 DIAGNOSIS — R1030 Lower abdominal pain, unspecified: Secondary | ICD-10-CM

## 2022-03-14 DIAGNOSIS — R21 Rash and other nonspecific skin eruption: Secondary | ICD-10-CM

## 2022-03-14 LAB — PTH, INTACT AND CALCIUM
Calcium: 11.1 mg/dL — ABNORMAL HIGH (ref 8.7–10.3)
PTH: 41 pg/mL (ref 15–65)

## 2022-03-14 LAB — MAGNESIUM: Magnesium: 2.2 mg/dL (ref 1.6–2.3)

## 2022-03-14 LAB — PHOSPHORUS: Phosphorus: 2.6 mg/dL — ABNORMAL LOW (ref 3.0–4.3)

## 2022-03-16 ENCOUNTER — Other Ambulatory Visit (HOSPITAL_COMMUNITY): Payer: Self-pay | Admitting: Family Medicine

## 2022-03-16 DIAGNOSIS — R21 Rash and other nonspecific skin eruption: Secondary | ICD-10-CM

## 2022-03-16 DIAGNOSIS — R1030 Lower abdominal pain, unspecified: Secondary | ICD-10-CM

## 2022-03-19 DIAGNOSIS — H04123 Dry eye syndrome of bilateral lacrimal glands: Secondary | ICD-10-CM | POA: Diagnosis not present

## 2022-03-21 DIAGNOSIS — E6609 Other obesity due to excess calories: Secondary | ICD-10-CM | POA: Diagnosis not present

## 2022-03-21 DIAGNOSIS — M1990 Unspecified osteoarthritis, unspecified site: Secondary | ICD-10-CM | POA: Diagnosis not present

## 2022-03-21 DIAGNOSIS — Z6835 Body mass index (BMI) 35.0-35.9, adult: Secondary | ICD-10-CM | POA: Diagnosis not present

## 2022-03-23 ENCOUNTER — Ambulatory Visit (HOSPITAL_COMMUNITY)
Admission: RE | Admit: 2022-03-23 | Discharge: 2022-03-23 | Disposition: A | Discharge status: Home / Self Care | Payer: Medicare PPO | Source: Ambulatory Visit | Attending: Family Medicine | Admitting: Family Medicine

## 2022-03-23 ENCOUNTER — Other Ambulatory Visit (HOSPITAL_COMMUNITY): Payer: Self-pay | Admitting: Family Medicine

## 2022-03-23 DIAGNOSIS — R21 Rash and other nonspecific skin eruption: Secondary | ICD-10-CM

## 2022-03-23 DIAGNOSIS — R1030 Lower abdominal pain, unspecified: Secondary | ICD-10-CM

## 2022-03-23 DIAGNOSIS — Z90710 Acquired absence of both cervix and uterus: Secondary | ICD-10-CM | POA: Diagnosis not present

## 2022-03-29 ENCOUNTER — Telehealth: Payer: Self-pay | Admitting: "Endocrinology

## 2022-03-29 ENCOUNTER — Encounter: Payer: Self-pay | Admitting: "Endocrinology

## 2022-03-29 ENCOUNTER — Ambulatory Visit: Payer: Medicare PPO | Admitting: "Endocrinology

## 2022-03-29 MED ORDER — METOPROLOL SUCCINATE ER 25 MG PO TB24: 25.0000 mg | ORAL_TABLET | Freq: Every day | ORAL | 1 refills | Status: DC

## 2022-03-29 NOTE — Progress Notes (Signed)
03/29/2022, 2:17 PM  Endocrinology follow-up note  Emma Williams is a 66 y.o.-year-old female, referred by her  [name]  , for evaluation for hypercalcemia/hyperparathyroidism.   Past Medical History:  Diagnosis Date   Arthritis    Chronic back pain    Hyperparathyroidism (HCC)    Hypertension     Past Surgical History:  Procedure Laterality Date   ABDOMINAL HYSTERECTOMY  1994   APPENDECTOMY      Social History   Tobacco Use   Smoking status: Never   Smokeless tobacco: Never  Vaping Use   Vaping Use: Never used  Substance Use Topics   Alcohol use: Not Currently    Comment: liquor daily "several drinks" - "don't drink every day" (01/07/14)   Drug use: No    Family History  Problem Relation Age of Onset   Hypertension Mother    Diabetes Mother    Stroke Mother 57   Heart failure Father    Diabetes Maternal Grandmother     Outpatient Encounter Medications as of 03/29/2022  Medication Sig   metoprolol succinate (TOPROL-XL) 25 MG 24 hr tablet Take 1 tablet (25 mg total) by mouth daily.   aspirin 81 MG EC tablet Take 81 mg by mouth daily. Swallow whole.   fexofenadine (ALLEGRA) 180 MG tablet Take 180 mg by mouth daily.   losartan (COZAAR) 25 MG tablet Take 25 mg by mouth daily.   Magnesium 400 MG CAPS Take 1 tablet by mouth daily.   meloxicam (MOBIC) 15 MG tablet Take 15 mg by mouth daily.   Multiple Vitamins-Minerals (OCUVITE PO) Take 1 tablet by mouth 2 (two) times a week.   Potassium Gluconate 595 MG CAPS Take 2 capsules by mouth daily.   predniSONE (DELTASONE) 10 MG tablet Take by mouth.   traMADol (ULTRAM) 50 MG tablet Take 50 mg by mouth every 4 (four) hours as needed.   [DISCONTINUED] hydrochlorothiazide (HYDRODIURIL) 25 MG tablet Take 50 mg by mouth daily.    No facility-administered encounter medications on file as of 03/29/2022.    Allergies  Allergen Reactions   Indocin  [Indomethacin] Other (See Comments)    Reaction: migraine headaches     HPI  Emma Williams returns to follow-up after she was seen in consultation for hypercalcemia.    She has had intermittent hypercalcemia for at least 5 years.   After her last visit, she was sent for 24-hour urine collection for calcium measurement.  This returned 24-hour urine calcium of 131 mg / 24 hours.  Her bone density revealed osteopenia of hips. She denies any past history of thyroid, adrenal, pituitary dysfunctions.    -Review of herreferral package of most recent labs reveals calcium of 11.2 with the corresponding PTH of 56 .  She was put on observation only intervention until repeat labs.  Her repeat labs on March 13, 2022 showed calcium 11.1, PTH at 41.   No prior history of fragility fractures or falls. No history of  kidney stones.  No history of CKD.  she is  on HCTZ for blood pressure treatment.  She is on multiple  vitamin supplements.  she is not on calcium supplements,  she eats dairy and green, leafy, vegetables on average amounts.  she does not have a family history of hypercalcemia, pituitary tumors, thyroid cancer, or osteoporosis.  -She underwent ovarian sparing hysterectomy in 1994.  I reviewed her chart and she also has a history of hypertension on treatment.   ROS:  Constitutional: + Fluctuating body weight,  no fatigue, no subjective hyperthermia, no subjective hypothermia Eyes: no blurry vision, no xerophthalmia   PE: BP 136/86   Pulse 60   Ht 5' 3.5" (1.613 m)   Wt 210 lb (95.3 kg)   BMI 36.62 kg/m , Body mass index is 36.62 kg/m. Wt Readings from Last 3 Encounters:  03/29/22 210 lb (95.3 kg)  09/26/21 222 lb 6.4 oz (100.9 kg)  09/12/21 221 lb 9.6 oz (100.5 kg)    Constitutional: + BMI of 38.6, not in acute distress, normal state of mind   CMP ( most recent) CMP     Component Value Date/Time   NA 137 04/11/2013 0611   K 3.7 04/11/2013 0611   CL 98 04/11/2013 0611    CO2 28 04/11/2013 0611   GLUCOSE 120 (H) 04/11/2013 0611   BUN 14 04/11/2013 0611   CREATININE 0.73 04/11/2013 0611   CALCIUM 11.1 (H) 03/13/2022 1041   PROT 6.7 04/11/2013 0611   ALBUMIN 3.6 04/11/2013 0611   AST 40 (H) 04/11/2013 0611   ALT 45 (H) 04/11/2013 0611   ALKPHOS 58 04/11/2013 0611   BILITOT 0.5 04/11/2013 0611   GFRNONAA >90 04/11/2013 0611   GFRAA >90 04/11/2013 0611     Diabetic Labs (most recent): Lab Results  Component Value Date   HGBA1C 5.6 04/10/2013      Lab Results  Component Value Date   TSH 1.066 04/10/2013     Assessment: 1. Hypercalcemia / Hyperparathyroidism  Plan: Patient has had several instances of elevated calcium, with the highest level being at 11.1 mg/dL. A corresponding intact PTH level was also unsuppressed at 56.  Her previsit labs show PTH stable and suppressed at 41.  -She is on multivitamin supplement, not taking any calcium supplements.    -Her recent bone density revealed osteopenia of hips.  There is no history of nephrolithiasis, no history of fragility fractures.  - No abdominal pain, no major mood disorders, no bone pain. This may still be mild primary hyperparathyroidism.  However she is advised to discontinue hydrochlorothiazide and repeat labs in 4 months.  I discussed and added Toprol 25 mg XL p.o. daily at breakfast for blood pressure management along with her losartan 25 mg p.o. daily at breakfast.   - I discussed with the patient about the physiology of calcium and parathyroid hormone, and possible  effects of  increased PTH/ Calcium , including kidney stones, cardiac dysrhythmias, osteoporosis, abdominal pain, etc.   She will return in 4 months with repeat PTH/calcium.  Her next bone density is due in 2 years.  She returns with calcium above 11.5, she will be considered for intervention-present with surgery.    He is complaining of intermittent hot flashes.  She will be offered plasma metanephrines to rule out  pheochromocytoma.  Her next labs will also include vitamin D panel.      She is advised to maintain close follow-up with her PCP.   I spent 23 minutes in the care of the patient today including review of labs from Thyroid Function, CMP, and other relevant labs ; imaging/biopsy records (  current and previous including abstractions from other facilities); face-to-face time discussing  her lab results and symptoms, medications doses, her options of short and long term treatment based on the latest standards of care / guidelines;   and documenting the encounter.  Beecher Mcardle  participated in the discussions, expressed understanding, and voiced agreement with the above plans.  All questions were answered to her satisfaction. she is encouraged to contact clinic should she have any questions or concerns prior to her return visit.    - Return in about 4 months (around 07/29/2022) for F/U with Pre-visit Labs.   Glade Lloyd, MD [facility] [address] [place], [place] [address] Phone: [phone]  Fax: [phone]    This note was partially dictated with voice recognition software. Similar sounding words can be transcribed inadequately or may not  be corrected upon review.  03/29/2022, 2:17 PM

## 2022-03-29 NOTE — Telephone Encounter (Signed)
New message    The patient is at front desk - asking Dr. Dorris Fetch to make sure her PCP is aware of any new changes  [place] .

## 2022-03-30 NOTE — Telephone Encounter (Signed)
Faxed copy of pt's visit notes to [facility].

## 2022-05-03 DIAGNOSIS — Z0001 Encounter for general adult medical examination with abnormal findings: Secondary | ICD-10-CM | POA: Diagnosis not present

## 2022-05-03 DIAGNOSIS — Z1331 Encounter for screening for depression: Secondary | ICD-10-CM | POA: Diagnosis not present

## 2022-05-03 DIAGNOSIS — M1990 Unspecified osteoarthritis, unspecified site: Secondary | ICD-10-CM | POA: Diagnosis not present

## 2022-05-03 DIAGNOSIS — E782 Mixed hyperlipidemia: Secondary | ICD-10-CM | POA: Diagnosis not present

## 2022-05-03 DIAGNOSIS — E213 Hyperparathyroidism, unspecified: Secondary | ICD-10-CM | POA: Diagnosis not present

## 2022-05-03 DIAGNOSIS — R21 Rash and other nonspecific skin eruption: Secondary | ICD-10-CM | POA: Diagnosis not present

## 2022-05-03 DIAGNOSIS — N951 Menopausal and female climacteric states: Secondary | ICD-10-CM | POA: Diagnosis not present

## 2022-05-03 DIAGNOSIS — Z6836 Body mass index (BMI) 36.0-36.9, adult: Secondary | ICD-10-CM | POA: Diagnosis not present

## 2022-05-17 DIAGNOSIS — Z1211 Encounter for screening for malignant neoplasm of colon: Secondary | ICD-10-CM | POA: Diagnosis not present

## 2022-07-30 ENCOUNTER — Ambulatory Visit: Payer: Medicare PPO | Admitting: "Endocrinology

## 2022-07-31 DIAGNOSIS — Z6836 Body mass index (BMI) 36.0-36.9, adult: Secondary | ICD-10-CM | POA: Diagnosis not present

## 2022-07-31 DIAGNOSIS — M1990 Unspecified osteoarthritis, unspecified site: Secondary | ICD-10-CM | POA: Diagnosis not present

## 2022-08-27 DIAGNOSIS — U071 COVID-19: Secondary | ICD-10-CM | POA: Diagnosis not present

## 2022-08-27 DIAGNOSIS — I10 Essential (primary) hypertension: Secondary | ICD-10-CM | POA: Diagnosis not present

## 2022-08-27 DIAGNOSIS — E6609 Other obesity due to excess calories: Secondary | ICD-10-CM | POA: Diagnosis not present

## 2022-08-27 DIAGNOSIS — Z6836 Body mass index (BMI) 36.0-36.9, adult: Secondary | ICD-10-CM | POA: Diagnosis not present

## 2022-08-27 DIAGNOSIS — E213 Hyperparathyroidism, unspecified: Secondary | ICD-10-CM | POA: Diagnosis not present

## 2022-10-26 DIAGNOSIS — I10 Essential (primary) hypertension: Secondary | ICD-10-CM | POA: Diagnosis not present

## 2022-10-26 DIAGNOSIS — R7301 Impaired fasting glucose: Secondary | ICD-10-CM | POA: Diagnosis not present

## 2022-10-26 DIAGNOSIS — E6609 Other obesity due to excess calories: Secondary | ICD-10-CM | POA: Diagnosis not present

## 2022-10-26 DIAGNOSIS — E782 Mixed hyperlipidemia: Secondary | ICD-10-CM | POA: Diagnosis not present

## 2022-10-26 DIAGNOSIS — D751 Secondary polycythemia: Secondary | ICD-10-CM | POA: Diagnosis not present

## 2022-10-26 DIAGNOSIS — M2041 Other hammer toe(s) (acquired), right foot: Secondary | ICD-10-CM | POA: Diagnosis not present

## 2022-10-26 DIAGNOSIS — Z6836 Body mass index (BMI) 36.0-36.9, adult: Secondary | ICD-10-CM | POA: Diagnosis not present

## 2022-10-30 DIAGNOSIS — D751 Secondary polycythemia: Secondary | ICD-10-CM | POA: Diagnosis not present

## 2022-10-30 DIAGNOSIS — I10 Essential (primary) hypertension: Secondary | ICD-10-CM | POA: Diagnosis not present

## 2023-01-25 DIAGNOSIS — Z6836 Body mass index (BMI) 36.0-36.9, adult: Secondary | ICD-10-CM | POA: Diagnosis not present

## 2023-01-25 DIAGNOSIS — E6609 Other obesity due to excess calories: Secondary | ICD-10-CM | POA: Diagnosis not present

## 2023-01-25 DIAGNOSIS — M1990 Unspecified osteoarthritis, unspecified site: Secondary | ICD-10-CM | POA: Diagnosis not present

## 2023-03-21 DIAGNOSIS — H04123 Dry eye syndrome of bilateral lacrimal glands: Secondary | ICD-10-CM | POA: Diagnosis not present

## 2023-05-09 DIAGNOSIS — E782 Mixed hyperlipidemia: Secondary | ICD-10-CM | POA: Diagnosis not present

## 2023-05-09 DIAGNOSIS — D751 Secondary polycythemia: Secondary | ICD-10-CM | POA: Diagnosis not present

## 2023-05-09 DIAGNOSIS — I10 Essential (primary) hypertension: Secondary | ICD-10-CM | POA: Diagnosis not present

## 2023-05-09 DIAGNOSIS — E6609 Other obesity due to excess calories: Secondary | ICD-10-CM | POA: Diagnosis not present

## 2023-05-09 DIAGNOSIS — M1990 Unspecified osteoarthritis, unspecified site: Secondary | ICD-10-CM | POA: Diagnosis not present

## 2023-05-09 DIAGNOSIS — R7301 Impaired fasting glucose: Secondary | ICD-10-CM | POA: Diagnosis not present

## 2023-05-09 DIAGNOSIS — Z0001 Encounter for general adult medical examination with abnormal findings: Secondary | ICD-10-CM | POA: Diagnosis not present

## 2023-05-09 DIAGNOSIS — Z6837 Body mass index (BMI) 37.0-37.9, adult: Secondary | ICD-10-CM | POA: Diagnosis not present

## 2023-05-09 DIAGNOSIS — Z1331 Encounter for screening for depression: Secondary | ICD-10-CM | POA: Diagnosis not present

## 2023-05-10 DIAGNOSIS — I10 Essential (primary) hypertension: Secondary | ICD-10-CM | POA: Diagnosis not present

## 2023-05-10 DIAGNOSIS — Z0001 Encounter for general adult medical examination with abnormal findings: Secondary | ICD-10-CM | POA: Diagnosis not present

## 2023-05-10 DIAGNOSIS — D751 Secondary polycythemia: Secondary | ICD-10-CM | POA: Diagnosis not present

## 2023-05-14 DIAGNOSIS — N179 Acute kidney failure, unspecified: Secondary | ICD-10-CM | POA: Diagnosis not present

## 2023-05-29 ENCOUNTER — Ambulatory Visit: Payer: Medicare PPO | Admitting: Orthopedic Surgery

## 2023-05-29 ENCOUNTER — Other Ambulatory Visit (INDEPENDENT_AMBULATORY_CARE_PROVIDER_SITE_OTHER): Payer: Medicare PPO

## 2023-05-29 ENCOUNTER — Encounter: Payer: Self-pay | Admitting: Orthopedic Surgery

## 2023-05-29 VITALS — BP 145/83 | HR 87 | Ht 63.5 in | Wt 214.0 lb

## 2023-05-29 DIAGNOSIS — G8929 Other chronic pain: Secondary | ICD-10-CM

## 2023-05-29 DIAGNOSIS — M25562 Pain in left knee: Secondary | ICD-10-CM

## 2023-05-29 NOTE — Patient Instructions (Addendum)
Please contact the clinic if you would like to consider a steroid injection in your left knee.

## 2023-05-30 NOTE — Progress Notes (Signed)
New Patient Visit  Assessment: Emma Williams is a 67 y.o. female with the following: 1. Chronic pain of left knee  Plan: [name] has pain in her left knee.  No specific injury.  This is been ongoing for about 6 months.  Radiographs demonstrate some joint space narrowing, especially within the lateral compartment.  Few osteophytes.  We discussed multiple treatment options, including a brace and an injection.  She was reluctant to try an injection in her joint, as she has been receiving IM steroid injections every few months.  She was fitted for a brace.  If she decides to return for an injection in the left knee, I am happy to help.  She will return to clinic as needed.  Follow-up: Return if symptoms worsen or fail to improve.  Subjective:  Chief Complaint  Patient presents with   Knee Pain    L knee pain for approx 6 mos. Pt states she's not been able to do her usual walks since the pain started.     History of Present Illness: Emma Williams is a 67 y.o. female who has been referred by Terie Purser, PA-C for evaluation of left knee pain.  She states she has had pain in the left knee for at least 6 months.  No specific injury.  She does have some swelling.  The pain is starting to affect her ability to walk on a regular basis.  She states that she gets IM injections approximately every 3 months, which does help with the pain in her knee, as well as other pains that she is experiencing.  She has not worked with physical therapy.  No intra-articular steroid injections.   Review of Systems: No fevers or chills No numbness or tingling No chest pain No shortness of breath No bowel or bladder dysfunction No GI distress No headaches   Medical History:  Past Medical History:  Diagnosis Date   Arthritis    Chronic back pain    Hyperparathyroidism (HCC)    Hypertension     Past Surgical History:  Procedure Laterality Date   ABDOMINAL HYSTERECTOMY  1994   APPENDECTOMY       Family History  Problem Relation Age of Onset   Hypertension Mother    Diabetes Mother    Stroke Mother 31   Heart failure Father    Diabetes Maternal Grandmother    Social History   Tobacco Use   Smoking status: Never   Smokeless tobacco: Never  Vaping Use   Vaping status: Never Used  Substance Use Topics   Alcohol use: Not Currently    Comment: liquor daily "several drinks" - "don't drink every day" (01/07/14)   Drug use: No    Allergies  Allergen Reactions   Indocin [Indomethacin] Other (See Comments)    Reaction: migraine headaches    Current Meds  Medication Sig   furosemide (LASIX) 20 MG tablet Take 20 mg by mouth daily.   losartan (COZAAR) 25 MG tablet Take 25 mg by mouth daily.   meloxicam (MOBIC) 15 MG tablet Take 15 mg by mouth daily.   metoprolol succinate (TOPROL-XL) 25 MG 24 hr tablet Take 1 tablet (25 mg total) by mouth daily.   traMADol (ULTRAM) 50 MG tablet Take 50 mg by mouth every 4 (four) hours as needed.    Objective: BP (!) 145/83   Pulse 87   Ht 5' 3.5" (1.613 m)   Wt 214 lb (97.1 kg)   BMI 37.31 kg/m  Physical Exam:  General: Alert and oriented. and No acute distress. Gait: Left sided antalgic gait.  Evaluation left knee demonstrates a mild effusion.  Tenderness palpation along the lateral joint line.  She is good range of motion.  No increased laxity varus valgus stress.  Negative Lachman.  IMAGING: I personally ordered and reviewed the following images   X-rays of the left knee were obtained in clinic today.  No acute injuries are noted.  Mild loss of joint space within the medial compartment.  Near complete loss of joint space within the lateral compartment.  Small osteophytes are appreciated.  No subchondral sclerosis.  No dislocations.  No bony lesions.  Impression: Left knee x-rays without acute injury, mild to moderate degenerative changes overall.   New Medications:  No orders of the defined types were placed in this  encounter.     Oliver Barre, MD  05/30/2023 11:48 AM

## 2023-06-26 ENCOUNTER — Encounter: Payer: Self-pay | Admitting: Orthopedic Surgery

## 2023-06-26 ENCOUNTER — Ambulatory Visit: Payer: Medicare PPO | Admitting: Orthopedic Surgery

## 2023-06-26 VITALS — BP 173/93 | HR 76 | Ht 62.0 in | Wt 211.0 lb

## 2023-06-26 DIAGNOSIS — G8929 Other chronic pain: Secondary | ICD-10-CM | POA: Diagnosis not present

## 2023-06-26 DIAGNOSIS — M25562 Pain in left knee: Secondary | ICD-10-CM

## 2023-06-26 NOTE — Progress Notes (Unsigned)
Return Patient Visit  Assessment: Emma Williams is a 67 y.o. female with the following: 1. Chronic pain of left knee  Plan: [name] still has pain in her left knee.  Limited improvement since I saw her.  She is interested in an injection.  This was completed in clinic today.  She will return to clinic as needed.  Procedure note injection Left knee joint   Verbal consent was obtained to inject the left knee joint  Timeout was completed to confirm the site of injection.  The skin was prepped with alcohol and ethyl chloride was sprayed at the injection site.  A 21-gauge needle was used to inject 40 mg of Depo-Medrol and 1% lidocaine (4 cc) into the left knee using an anterolateral approach.  There were no complications. A sterile bandage was applied.      Follow-up: Return if symptoms worsen or fail to improve.  Subjective:  Chief Complaint  Patient presents with   Knee Pain    Patient in with left knee pain      History of Present Illness: Emma Williams is a 67 y.o. female who returns to clinic for repeat evaluation of left knee pain.  I saw her recently.  At that time, we discussed left knee steroid injection.  She was reluctant.  She was fitted for a brace.  She continues to have pain.  Brace has not improved her symptoms.  She has tried intramuscular steroid injections, gets these routinely for her pain.  However, she is now ready to proceed with an injection.   Review of Systems: No fevers or chills No numbness or tingling No chest pain No shortness of breath No bowel or bladder dysfunction No GI distress No headaches    Objective: BP (!) 173/93 (Cuff Size: Large)   Pulse 76   Ht 5\' 2"  (1.575 m)   Wt 211 lb (95.7 kg)   BMI 38.59 kg/m   Physical Exam:  General: Alert and oriented. and No acute distress. Gait: Left sided antalgic gait.  Evaluation left knee demonstrates a mild effusion.  Tenderness palpation along the lateral joint line.  She is good  range of motion.  No increased laxity varus valgus stress.  Negative Lachman.  IMAGING: No new imaging obtained today     New Medications:  No orders of the defined types were placed in this encounter.     Oliver Barre, MD  06/26/2023 3:56 PM

## 2023-06-26 NOTE — Patient Instructions (Signed)

## 2023-06-27 ENCOUNTER — Encounter: Payer: Self-pay | Admitting: Orthopedic Surgery

## 2023-07-26 DIAGNOSIS — E6609 Other obesity due to excess calories: Secondary | ICD-10-CM | POA: Diagnosis not present

## 2023-07-26 DIAGNOSIS — Z6837 Body mass index (BMI) 37.0-37.9, adult: Secondary | ICD-10-CM | POA: Diagnosis not present

## 2023-07-26 DIAGNOSIS — I10 Essential (primary) hypertension: Secondary | ICD-10-CM | POA: Diagnosis not present

## 2023-07-26 DIAGNOSIS — M1990 Unspecified osteoarthritis, unspecified site: Secondary | ICD-10-CM | POA: Diagnosis not present

## 2023-08-06 ENCOUNTER — Other Ambulatory Visit (INDEPENDENT_AMBULATORY_CARE_PROVIDER_SITE_OTHER): Payer: Self-pay

## 2023-08-06 ENCOUNTER — Ambulatory Visit: Payer: Medicare PPO | Admitting: Orthopedic Surgery

## 2023-08-06 ENCOUNTER — Encounter: Payer: Self-pay | Admitting: Orthopedic Surgery

## 2023-08-06 ENCOUNTER — Other Ambulatory Visit: Payer: Self-pay | Admitting: Orthopedic Surgery

## 2023-08-06 VITALS — BP 137/82 | HR 85 | Ht 62.0 in | Wt 214.0 lb

## 2023-08-06 DIAGNOSIS — M25561 Pain in right knee: Secondary | ICD-10-CM | POA: Diagnosis not present

## 2023-08-06 DIAGNOSIS — G8929 Other chronic pain: Secondary | ICD-10-CM

## 2023-08-07 NOTE — Progress Notes (Unsigned)
Return Patient Visit  Assessment: Emma Williams is a 68 y.o. female with the following: 1. Chronic pain of right knee  Plan: [name] has chronic pain in her right knee.  We discussed multiple options.  She would like a brace for her right knee.  She was fitted in clinic today.  She will return as needed.  If pain worsens, can consider an injection in the right knee.       Follow-up: Return if symptoms worsen or fail to improve.  Subjective:  Chief Complaint  Patient presents with   Knee Pain    Pain in the left knee     History of Present Illness: Emma Williams is a 68 y.o. female who returns to clinic for evaluation of right  knee pain.  I saw her recently.  At that time, we discussed left knee steroid injection.  She was reluctant.  She was fitted for a brace.  She continues to have pain.  Brace has not improved her symptoms.  She has tried intramuscular steroid injections, gets these routinely for her pain.  However, she is now ready to proceed with an injection.   Review of Systems: No fevers or chills No numbness or tingling No chest pain No shortness of breath No bowel or bladder dysfunction No GI distress No headaches    Objective: BP 137/82   Pulse 85   Ht 5\' 2"  (1.575 m)   Wt 214 lb (97.1 kg)   BMI 39.14 kg/m   Physical Exam:  General: Alert and oriented. and No acute distress. Gait: Left sided antalgic gait.  Evaluation left knee demonstrates a mild effusion.  Tenderness palpation along the lateral joint line.  She is good range of motion.  No increased laxity varus valgus stress.  Negative Lachman.  IMAGING: No new imaging obtained today     New Medications:  No orders of the defined types were placed in this encounter.     Oliver Barre, MD  08/07/2023 11:01 PM

## 2023-08-08 ENCOUNTER — Encounter: Payer: Self-pay | Admitting: Orthopedic Surgery

## 2023-09-25 ENCOUNTER — Ambulatory Visit: Payer: Medicare PPO | Admitting: Orthopedic Surgery

## 2023-09-25 ENCOUNTER — Encounter: Payer: Self-pay | Admitting: Orthopedic Surgery

## 2023-09-25 DIAGNOSIS — G8929 Other chronic pain: Secondary | ICD-10-CM | POA: Diagnosis not present

## 2023-09-25 DIAGNOSIS — M25562 Pain in left knee: Secondary | ICD-10-CM | POA: Diagnosis not present

## 2023-09-25 NOTE — Patient Instructions (Signed)

## 2023-09-26 NOTE — Progress Notes (Signed)
 Return Patient Visit  Assessment: Emma Williams is a 68 y.o. female with the following: 1. Chronic pain of left knee  Plan: Emma Williams has recurrent pain in her left knee.  Prior injection was effective.  She would like to repeat an injection today.  We briefly discussed total knee arthroplasty.  She will schedule follow-up appointment with Dr. Romeo Apple so she can discuss the procedure in more detail.   Procedure note injection Left knee joint   Verbal consent was obtained to inject the left knee joint  Timeout was completed to confirm the site of injection.  The skin was prepped with alcohol and ethyl chloride was sprayed at the injection site.  A 21-gauge needle was used to inject 40 mg of Depo-Medrol and 1% lidocaine (4 cc) into the left knee using an anterolateral approach.  There were no complications. A sterile bandage was applied.      Follow-up: Return for Follow up with Dr. Romeo Apple to discuss TKA.  Subjective:  Chief Complaint  Patient presents with   Injections    Left knee    History of Present Illness: Emma Williams is a 68 y.o. female who returns to clinic for repeat evaluation of left knee pain.  I have seen her several times for both knees.  I have injected her left knee.  This has been helpful in the past.  Over the past several weeks, she has noticed progressive worsening of pain in the left knee.  She is interested in another injection.  Review of Systems: No fevers or chills No numbness or tingling No chest pain No shortness of breath No bowel or bladder dysfunction No GI distress No headaches    Objective: There were no vitals taken for this visit.  Physical Exam:  General: Alert and oriented. and No acute distress. Gait: Left sided antalgic gait.  Evaluation left knee demonstrates a mild effusion.  Tenderness palpation along the lateral joint line.  She has good range of motion.  No increased laxity varus valgus stress.  Negative  Lachman.  IMAGING: No new imaging obtained today     New Medications:  No orders of the defined types were placed in this encounter.     Oliver Barre, MD  09/26/2023 10:14 PM

## 2023-10-24 DIAGNOSIS — K1370 Unspecified lesions of oral mucosa: Secondary | ICD-10-CM | POA: Diagnosis not present

## 2023-10-24 DIAGNOSIS — I10 Essential (primary) hypertension: Secondary | ICD-10-CM | POA: Diagnosis not present

## 2023-10-24 DIAGNOSIS — Z6838 Body mass index (BMI) 38.0-38.9, adult: Secondary | ICD-10-CM | POA: Diagnosis not present

## 2023-10-24 DIAGNOSIS — M1990 Unspecified osteoarthritis, unspecified site: Secondary | ICD-10-CM | POA: Diagnosis not present

## 2023-10-24 DIAGNOSIS — E6609 Other obesity due to excess calories: Secondary | ICD-10-CM | POA: Diagnosis not present

## 2023-11-25 ENCOUNTER — Ambulatory Visit: Admitting: Orthopedic Surgery

## 2023-11-25 ENCOUNTER — Other Ambulatory Visit (INDEPENDENT_AMBULATORY_CARE_PROVIDER_SITE_OTHER): Payer: Self-pay

## 2023-11-25 ENCOUNTER — Encounter: Payer: Self-pay | Admitting: Orthopedic Surgery

## 2023-11-25 DIAGNOSIS — M1712 Unilateral primary osteoarthritis, left knee: Secondary | ICD-10-CM | POA: Diagnosis not present

## 2023-11-25 DIAGNOSIS — G8929 Other chronic pain: Secondary | ICD-10-CM | POA: Diagnosis not present

## 2023-11-25 DIAGNOSIS — M25572 Pain in left ankle and joints of left foot: Secondary | ICD-10-CM | POA: Diagnosis not present

## 2023-11-25 NOTE — Progress Notes (Signed)
   There were no vitals taken for this visit.  There is no height or weight on file to calculate BMI.  Chief Complaint  Patient presents with   Knee Pain    Left- to discuss surgery TKA    No diagnosis found.  DOI/DOS/ Date: 11/25/23  Worse wants to discuss surgery Left knee

## 2023-11-25 NOTE — Progress Notes (Signed)
 Chief Complaint  Patient presents with   Knee Pain    Left- to discuss surgery TKA    68 year old female presents requesting knee replacement surgery after failing nonoperative care with Dr. Paz Bott  She underwent injection of cortisone She is on meloxicam She wore a knee brace  She complains of lateral knee pain.  She complains of difficulty with all her activities of daily living  Says she is gaining weight  She has a lot of problems.  She has chronic back pain was seen by orthospine Dr. Faylene Hoots she received an epidural injection and eventually stopped seeing him and tolerated her back pain  She still has back pain and she says she has arthritis in all of her joints.  She gets cortisone shots IM on a monthly basis  She has gained a lot of weight her appetite is increased  She was on tramadol at 1 point   Past Medical History:  Diagnosis Date   Arthritis    Chronic back pain    Hyperparathyroidism (HCC)    Hypertension    Past Surgical History:  Procedure Laterality Date   ABDOMINAL HYSTERECTOMY  1994   APPENDECTOMY     Social History   Tobacco Use   Smoking status: Never   Smokeless tobacco: Never  Vaping Use   Vaping status: Never Used  Substance Use Topics   Alcohol use: Not Currently    Comment: liquor daily "several drinks" - "don't drink every day" (01/07/14)   Drug use: No    There were no vitals taken for this visit.  Physical Exam Vitals and nursing note reviewed.  Constitutional:      Appearance: Normal appearance.  HENT:     Head: Normocephalic and atraumatic.  Eyes:     General: No scleral icterus.       Right eye: No discharge.        Left eye: No discharge.     Extraocular Movements: Extraocular movements intact.     Conjunctiva/sclera: Conjunctivae normal.     Pupils: Pupils are equal, round, and reactive to light.  Cardiovascular:     Rate and Rhythm: Normal rate.     Pulses: Normal pulses.  Skin:    General: Skin is warm and dry.      Capillary Refill: Capillary refill takes less than 2 seconds.  Neurological:     General: No focal deficit present.     Mental Status: She is alert and oriented to person, place, and time.  Psychiatric:        Mood and Affect: Mood normal.        Behavior: Behavior normal.        Thought Content: Thought content normal.        Judgment: Judgment normal.     The left knee is indeed in valgus and there is tenderness in the lateral compartment No effusion Knee comes to full extension of not hyperextension  Flexion is approximately 110 degrees without instability   She also has tenderness in her lower back right left and midline  One of her complaints is that her leg gives way.  Probably caused by her spine condition   DG Ankle Complete Left Result Date: 11/25/2023 Acute recent left ankle pain anterior ankle joint 3 views left ankle There is no evidence of a fracture dislocation or bone lesion In the midfoot we do see some arthritis We also see a calcaneal plantar spur Impression midfoot arthritis   Prior imaging studies of her  knee  Reveal osteoarthritis left knee lateral compartment  Encounter Diagnoses  Name Primary?   Pain in left ankle and joints of left foot Yes   Chronic pain of left knee    Primary osteoarthritis of left knee     She is a candidate for knee replacement provided the following things happen  #1 no cortisone injections in her intramuscular area 30 days prior to surgery  #2 wait at least 3 months after knee injection last injection 09/25/2023  #3 maintain BMI less than 40   She will follow-up with us  after June 19 to be weighed to see if her BMI is appropriate  At that time we can schedule surgery  As far as her ankle goes I do not see any problems there She does have some midfoot arthritis nothing to do for that at this point

## 2023-12-30 ENCOUNTER — Other Ambulatory Visit (INDEPENDENT_AMBULATORY_CARE_PROVIDER_SITE_OTHER): Payer: Self-pay

## 2023-12-30 ENCOUNTER — Encounter: Payer: Self-pay | Admitting: Orthopedic Surgery

## 2023-12-30 ENCOUNTER — Ambulatory Visit: Admitting: Orthopedic Surgery

## 2023-12-30 DIAGNOSIS — Z01818 Encounter for other preprocedural examination: Secondary | ICD-10-CM

## 2023-12-30 DIAGNOSIS — G8929 Other chronic pain: Secondary | ICD-10-CM

## 2023-12-30 DIAGNOSIS — M1712 Unilateral primary osteoarthritis, left knee: Secondary | ICD-10-CM

## 2023-12-30 MED ORDER — BUPIVACAINE-MELOXICAM ER 400-12 MG/14ML IJ SOLN: 400.0000 mg | Freq: Once | INTRAMUSCULAR | Status: AC

## 2023-12-30 MED ORDER — METHYLPREDNISOLONE ACETATE 40 MG/ML IJ SUSP
40.0000 mg | Freq: Once | INTRAMUSCULAR | Status: AC
  Administered 2023-12-30: 40 mg via INTRA_ARTICULAR

## 2023-12-30 NOTE — Progress Notes (Signed)
 Chief Complaint  Patient presents with   Knee Pain    Left     History left knee pain  This is a 68 year old female previously seen by Dr. Dario.  She was diagnosed with osteoarthritis of the left knee she was given a cortisone injection she was placed on meloxicam she is here for discussion of surgery  She reports are worsening function.  She reports more problems with her activities of daily living such as getting up from a seated position and walking steps  Pain is now up to 8-9 out of 10 and constant  Review of systems no shortness of breath no chest pain bowel bladder function intact without any major abnormalities no fever no chills  Past Medical History:  Diagnosis Date   Arthritis    Chronic back pain    Hyperparathyroidism (HCC)    Hypertension    Past Surgical History:  Procedure Laterality Date   ABDOMINAL HYSTERECTOMY  1994   APPENDECTOMY     Social History   Tobacco Use   Smoking status: Never   Smokeless tobacco: Never  Vaping Use   Vaping status: Never Used  Substance Use Topics   Alcohol use: Not Currently    Comment: liquor daily several drinks - don't drink every day (01/07/14)   Drug use: No   Family History  Problem Relation Age of Onset   Hypertension Mother    Diabetes Mother    Stroke Mother 55   Heart failure Father    Diabetes Maternal Grandmother     Allergies  Allergen Reactions   Indocin [Indomethacin] Other (See Comments)    Reaction: migraine headaches    General Well-developed well-nourished female BMI is 39 grooming hygiene normal  Mental status awake alert and oriented x 3  Psychiatric exam mood and affect normal  Cardiovascular normal pulse perfusion without edema normal color and temperature  Musculoskeletal and skin  Focusing our exam on the involved left knee  The left knee is in valgus alignment  The skin envelope is normal  She is tender in the lateral compartment with crepitance on range of motion  She  does have flexion contracture of 3 degrees the knee flexes up to 115 degrees for total arc of motion of 112 degrees  Her extensor mechanism is intact with good strength and there is no instability of the joint  DG Bone Length Result Date: 12/30/2023 Left knee images bone length Valgus alignment to the limb weightbearing axis comes to the lateral margin of the lateral compartment arthritis is seen in this compartment as well. Impression OA left knee valgus alignment of the extremity   Prior imaging studies were also included which show osteoarthritis of the left knee with valgus alignment and lateral compartment compromise  The procedure has been fully reviewed with the patient; The risks and benefits of surgery have been discussed and explained and understood. Alternative treatment has also been reviewed, questions were encouraged and answered. The postoperative plan is also been reviewed.   Stairs to get in less than 3  Everything is on 1 floor  Aspirin  will be twice a day for 6 weeks for DVT prophylaxis which was discussed including PE  We also discussed the need to go to physical therapy which she did not want to do.  We encouraged her that this would be a major mistake and she agreed to go to therapy  We also discussed infection and requiring 2-3 surgeries to eradicate if it became clear that the  infection was not clearing  The patient wishes to proceed with a total knee arthroplasty of the left knee  I went ahead and gave her her injection today instead of having her come in tomorrow to see Dr. Dario  Procedure note left knee injection   verbal consent was obtained to inject left knee joint  Timeout was completed to confirm the site of injection  The medications used were depomedrol 40 mg and 1% lidocaine 3 cc Anesthesia was provided by ethyl chloride and the skin was prepped with alcohol.  After cleaning the skin with alcohol a 20-gauge needle was used to inject the left  knee joint. There were no complications. A sterile bandage was applied.

## 2023-12-30 NOTE — Progress Notes (Signed)
   There were no vitals taken for this visit.  There is no height or weight on file to calculate BMI.  Chief Complaint  Patient presents with   Knee Pain    Left     Encounter Diagnoses  Name Primary?   Primary osteoarthritis of left knee Yes   Chronic pain of left knee     DOI/DOS/ Date: ongoing  Unchanged  Has had injections by Dr Onesimo patient states they are helpful wants to discuss this and discuss surgery

## 2023-12-30 NOTE — Patient Instructions (Signed)
 Your surgery will be at [facility] by Dr Margrette on Oct 7th  plan to be in hospital overnight. The hospital will contact you with a preoperative appointment to discuss Anesthesia.  Please arrive on time or 15 minutes early for the preoperative appointment, they have a very tight schedule if you are late or do not come in your surgery will be cancelled.  The phone number for the preop area is [phone]. Please bring your medications with you for the appointment. They will tell you the arrival time for surgery and medication instructions when you have your preoperative evaluation. Do not wear nail polish the day of your surgery and if you take Phentermine you need to stop this medication ONE WEEK prior to your surgery. f you take Invokana, Farxiga, Jardiance, or Steglatro) - Hold 72 hours before the procedure.  If you take Ozempic,  Bydureo, Mounjaro, Brenda or Trulicity do not take for 8 days before your surgery. If you take Victoza, Rybelsis, Saxenda or Adlyxi stop 24 hours before the procedure. Please arrive at the hospital 2 hours before procedure if scheduled at 9:30 or later in the day or at the time the nurse tells you at your preoperative visit.   If you have my chart do not use the time given in my chart use the time given to you by the nurse during your preoperative visit.   Your surgery  time may change. Please be available for phone calls the day of your surgery and the day before. The Short Stay department may need to discuss changes about your surgery time. Not reaching the you could lead to procedure delays and possible cancellation.  You must have a ride home and someone to stay with you for 24 to 48 hours. The person taking you home will receive and sign for the your discharge instructions.  Please be prepared to give your support person's name and telephone number to [facility] [employer]. Dr Margrette will need that name and phone number post procedure.   You will also get a call  from a representative of [employer], they have a machine that you will use in the first few weeks after surgery. It is called a CPM.   You will have home physical therapy for 2 weeks after surgery, the home health agency will call you before or just following the surgery to set up visits. [facility] is the agency we normally use, unless you request another agency.   You will get a call also from outpatient therapy for therapy starting when the home therapy is done.  If you have questions or need to Reschedule the surgery, call the office ask for Malakai Schoenherr.

## 2023-12-31 ENCOUNTER — Ambulatory Visit: Admitting: Orthopedic Surgery

## 2024-01-13 ENCOUNTER — Institutional Professional Consult (permissible substitution) (INDEPENDENT_AMBULATORY_CARE_PROVIDER_SITE_OTHER): Admitting: Otolaryngology

## 2024-01-27 DIAGNOSIS — M1990 Unspecified osteoarthritis, unspecified site: Secondary | ICD-10-CM | POA: Diagnosis not present

## 2024-02-11 DIAGNOSIS — M25562 Pain in left knee: Secondary | ICD-10-CM | POA: Diagnosis not present

## 2024-02-11 DIAGNOSIS — I10 Essential (primary) hypertension: Secondary | ICD-10-CM | POA: Diagnosis not present

## 2024-02-11 DIAGNOSIS — G894 Chronic pain syndrome: Secondary | ICD-10-CM | POA: Diagnosis not present

## 2024-02-11 DIAGNOSIS — Z6837 Body mass index (BMI) 37.0-37.9, adult: Secondary | ICD-10-CM | POA: Diagnosis not present

## 2024-02-11 DIAGNOSIS — Z7689 Persons encountering health services in other specified circumstances: Secondary | ICD-10-CM | POA: Diagnosis not present

## 2024-02-11 DIAGNOSIS — J302 Other seasonal allergic rhinitis: Secondary | ICD-10-CM | POA: Diagnosis not present

## 2024-02-25 ENCOUNTER — Telehealth: Payer: Self-pay | Admitting: Orthopedic Surgery

## 2024-02-25 NOTE — Telephone Encounter (Signed)
 Dr. Areatha pt - pt presented to the office today stating she is supposed to have surgery 04/14/24 and she wants to reschedule.  [phone]

## 2024-02-27 NOTE — Telephone Encounter (Signed)
 She wants to cancel surgery for now  Will discuss RS to January  She is scheduled for injection end of September   FYI

## 2024-02-27 NOTE — Telephone Encounter (Signed)
 Left message for her to call me back.

## 2024-03-18 DIAGNOSIS — J069 Acute upper respiratory infection, unspecified: Secondary | ICD-10-CM | POA: Diagnosis not present

## 2024-03-20 DIAGNOSIS — J069 Acute upper respiratory infection, unspecified: Secondary | ICD-10-CM | POA: Diagnosis not present

## 2024-04-01 DIAGNOSIS — R059 Cough, unspecified: Secondary | ICD-10-CM | POA: Insufficient documentation

## 2024-04-02 ENCOUNTER — Ambulatory Visit: Admitting: Orthopedic Surgery

## 2024-04-08 ENCOUNTER — Encounter: Payer: Self-pay | Admitting: Orthopedic Surgery

## 2024-04-08 ENCOUNTER — Ambulatory Visit (INDEPENDENT_AMBULATORY_CARE_PROVIDER_SITE_OTHER): Admitting: Orthopedic Surgery

## 2024-04-08 DIAGNOSIS — G8929 Other chronic pain: Secondary | ICD-10-CM

## 2024-04-08 DIAGNOSIS — M1712 Unilateral primary osteoarthritis, left knee: Secondary | ICD-10-CM | POA: Diagnosis not present

## 2024-04-08 MED ORDER — METHYLPREDNISOLONE ACETATE 40 MG/ML IJ SUSP
40.0000 mg | Freq: Once | INTRAMUSCULAR | Status: AC
  Administered 2024-04-08: 40 mg via INTRA_ARTICULAR

## 2024-04-08 NOTE — Progress Notes (Signed)
   Chief Complaint  Patient presents with   Injections    Left knee    Encounter Diagnoses  Name Primary?   Primary osteoarthritis of left knee Yes   Chronic pain of left knee    Procedure note left knee injection   verbal consent was obtained to inject left knee joint  Timeout was completed to confirm the site of injection  The medications used were depomedrol 40 mg and 1% lidocaine 3 cc Anesthesia was provided by ethyl chloride and the skin was prepped with alcohol.  After cleaning the skin with alcohol a 20-gauge needle was used to inject the left knee joint. There were no complications. A sterile bandage was applied.

## 2024-04-14 ENCOUNTER — Ambulatory Visit (HOSPITAL_COMMUNITY): Admit: 2024-04-14 | Admitting: Orthopedic Surgery

## 2024-04-14 SURGERY — ARTHROPLASTY, KNEE, TOTAL
Anesthesia: Choice | Site: Knee | Laterality: Left

## 2024-05-13 DIAGNOSIS — Z0001 Encounter for general adult medical examination with abnormal findings: Secondary | ICD-10-CM | POA: Diagnosis not present

## 2024-05-13 DIAGNOSIS — I10 Essential (primary) hypertension: Secondary | ICD-10-CM | POA: Diagnosis not present

## 2024-05-13 DIAGNOSIS — M8580 Other specified disorders of bone density and structure, unspecified site: Secondary | ICD-10-CM | POA: Insufficient documentation

## 2024-05-13 DIAGNOSIS — M1712 Unilateral primary osteoarthritis, left knee: Secondary | ICD-10-CM | POA: Diagnosis not present

## 2024-05-13 DIAGNOSIS — K760 Fatty (change of) liver, not elsewhere classified: Secondary | ICD-10-CM | POA: Insufficient documentation

## 2024-05-13 DIAGNOSIS — G894 Chronic pain syndrome: Secondary | ICD-10-CM | POA: Insufficient documentation

## 2024-05-13 DIAGNOSIS — E213 Hyperparathyroidism, unspecified: Secondary | ICD-10-CM | POA: Insufficient documentation

## 2024-05-13 DIAGNOSIS — L97509 Non-pressure chronic ulcer of other part of unspecified foot with unspecified severity: Secondary | ICD-10-CM | POA: Insufficient documentation

## 2024-05-13 DIAGNOSIS — R809 Proteinuria, unspecified: Secondary | ICD-10-CM | POA: Insufficient documentation

## 2024-05-13 DIAGNOSIS — M1711 Unilateral primary osteoarthritis, right knee: Secondary | ICD-10-CM | POA: Diagnosis not present

## 2024-05-14 ENCOUNTER — Other Ambulatory Visit (HOSPITAL_COMMUNITY): Payer: Self-pay | Admitting: Internal Medicine

## 2024-05-14 DIAGNOSIS — M8580 Other specified disorders of bone density and structure, unspecified site: Secondary | ICD-10-CM

## 2024-05-22 ENCOUNTER — Ambulatory Visit (HOSPITAL_COMMUNITY)
Admission: RE | Admit: 2024-05-22 | Discharge: 2024-05-22 | Disposition: A | Discharge status: Home / Self Care | Source: Ambulatory Visit | Attending: Internal Medicine | Admitting: Internal Medicine

## 2024-05-22 DIAGNOSIS — M8580 Other specified disorders of bone density and structure, unspecified site: Secondary | ICD-10-CM

## 2024-05-22 DIAGNOSIS — M810 Age-related osteoporosis without current pathological fracture: Secondary | ICD-10-CM | POA: Diagnosis not present

## 2024-05-22 DIAGNOSIS — Z780 Asymptomatic menopausal state: Secondary | ICD-10-CM | POA: Insufficient documentation

## 2024-06-25 DIAGNOSIS — M810 Age-related osteoporosis without current pathological fracture: Secondary | ICD-10-CM | POA: Insufficient documentation

## 2024-07-06 ENCOUNTER — Ambulatory Visit: Admitting: Orthopedic Surgery

## 2024-07-06 DIAGNOSIS — L97521 Non-pressure chronic ulcer of other part of left foot limited to breakdown of skin: Secondary | ICD-10-CM

## 2024-07-06 DIAGNOSIS — M6702 Short Achilles tendon (acquired), left ankle: Secondary | ICD-10-CM

## 2024-07-07 ENCOUNTER — Encounter: Payer: Self-pay | Admitting: Orthopedic Surgery

## 2024-07-07 NOTE — Progress Notes (Signed)
 "  Office Visit Note   Patient: Emma Williams           Date of Birth: 1956-02-18           MRN: [id number] Visit Date: 07/06/2024              Requested by: Shona Norleen PEDLAR, MD [address] [place],  [place] [address] PCP: Leonce Lucie PARAS, PA-C  Chief Complaint  Patient presents with   Left Foot - Wound Check      HPI: Discussed the use of AI scribe software for clinical note transcription with the patient, who gave verbal consent to proceed.  History of Present Illness Emma Williams is a 68 year old female with Achilles contracture and cavus foot who presents with painful plantar calluses and skin discoloration of the left forefoot.  She reports the development of painful calluses beneath the first metatarsal head and on the plantar aspect of the great toe of the left foot. The callus on the great toe is particularly sensitive. Pain is intermittent, primarily triggered by activity or direct pressure, and is described as a sharp, intense sensation when touched or squeezed. Discomfort is not constant.  She has observed black discoloration at the tip of the callus, which she attributes to subcutaneous hemorrhage. This is associated with bruising and represents a new finding compared to prior calluses.  She has worn soft, comfortable hiking shoes for years without issue, but recently noted that the soles bend excessively, increasing forefoot pressure. She has a high arch and has begun to notice similar changes in the right foot. No prior workup or specific treatments have been pursued.     Assessment & Plan: Visit Diagnoses:  1. Chronic toe ulcer, left, limited to breakdown of skin (HCC)   2. 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 Etiology is age-related and biomechanical. Non-surgical management preferred. - Instructed Achilles tendon stretching exercises three times daily for one minute. - Recommended stiff sole sneaker to reduce forefoot pressure. - Advised resuming current sneakers once Achilles flexibility improves. - Deferred callus debridement per her preference. - Advised follow-up if symptoms persist after two months of stretching.      Follow-Up Instructions: No follow-ups on file.   Ortho Exam  Patient is alert, oriented, no adenopathy, well-dressed, normal affect, normal respiratory effort. Physical Exam MUSCULOSKELETAL: Achilles contracture with dorsiflexion short of neutral with knee extended. Cavus foot with plantar flex first ray and decreased dorsiflexion of great toe MTP joint. SKIN: Callus beneath first metatarsal head and on plantar aspect of great toe.  Ulceration plantar aspect great toe secondary to pressure overloading from the cavovarus foot and Achilles contracture.      Imaging: No results found. No images are attached to the encounter.  Labs: Lab Results  Component Value Date   HGBA1C 5.6 04/10/2013     Lab Results  Component Value Date   ALBUMIN 3.6 04/11/2013   ALBUMIN 3.9 04/10/2013    Lab Results  Component Value Date   MG 2.2 03/13/2022   No results found for: VD25OH  No results found for: PREALBUMIN    Latest Ref Rng & Units 04/10/2013  10:27 AM  CBC EXTENDED  WBC 4.0 - 10.5 K/uL 5.6   RBC 3.87 - 5.11 MIL/uL 4.57   Hemoglobin 12.0 - 15.0 g/dL 86.0   HCT 63.9 - 53.9 % 40.1   Platelets 150 - 400 K/uL 194   NEUT# 1.7 - 7.7 K/uL 3.5   Lymph# 0.7 - 4.0 K/uL 1.5      There is no height or weight on file to calculate BMI.  Orders:  No orders of the defined types were placed in this encounter.  No orders of the defined types were placed in  this encounter.    Procedures: No procedures performed  Clinical Data: No additional findings.  ROS:  All other systems negative, except as noted in the HPI. Review of Systems  Objective: Vital Signs: There were no vitals taken for this visit.  Specialty Comments:  No specialty comments available.  PMFS History: Patient Active Problem List   Diagnosis Date Noted   Cough 04/01/2024   Upper respiratory tract infection 03/18/2024   Viral upper respiratory tract infection 03/18/2024   Hypercalcemia 09/12/2021   Chest pain 01/07/2014   Dyspnea 04/10/2013   Morbid obesity (HCC) 04/10/2013   Alcohol abuse 04/10/2013   HTN (hypertension) 04/10/2013   Past Medical History:  Diagnosis Date   Arthritis    Chronic back pain    Hyperparathyroidism    Hypertension     Family History  Problem Relation Age of Onset   Hypertension Mother    Diabetes Mother    Stroke Mother 79   Heart failure Father    Diabetes Maternal Grandmother     Past Surgical History:  Procedure Laterality Date   ABDOMINAL HYSTERECTOMY  1994   APPENDECTOMY     Social History   Occupational History   Not on file  Tobacco Use   Smoking status: Never   Smokeless tobacco: Never  Vaping Use   Vaping status: Never Used  Substance and Sexual Activity   Alcohol use: Not Currently    Comment: liquor daily several drinks - don't drink every day (01/07/14)   Drug use: No   Sexual activity: Not on file         "

## 2024-07-09 ENCOUNTER — Ambulatory Visit: Admitting: Orthopedic Surgery

## 2024-07-13 ENCOUNTER — Ambulatory Visit: Admitting: Orthopedic Surgery

## 2024-07-13 ENCOUNTER — Encounter: Payer: Self-pay | Admitting: Orthopedic Surgery

## 2024-07-13 ENCOUNTER — Telehealth: Payer: Self-pay | Admitting: Orthopedic Surgery

## 2024-07-13 DIAGNOSIS — L97521 Non-pressure chronic ulcer of other part of left foot limited to breakdown of skin: Secondary | ICD-10-CM

## 2024-07-13 DIAGNOSIS — M1712 Unilateral primary osteoarthritis, left knee: Secondary | ICD-10-CM | POA: Diagnosis not present

## 2024-07-13 MED ORDER — METHYLPREDNISOLONE ACETATE 40 MG/ML IJ SUSP: 40.0000 mg | Freq: Once | INTRAMUSCULAR | Status: AC

## 2024-07-13 NOTE — Progress Notes (Signed)
" ° °  Patient: Emma Williams           Date of Birth: October 09, 1955           MRN: [id number] Visit Date: 07/13/2024 Requested by: Leonce Lucie PARAS, PA-C [address] [place],  [place] [address] PCP: Leonce Lucie PARAS, PA-C  Encounter Diagnoses  Name Primary?   Primary osteoarthritis of left knee Yes   Chronic toe ulcer, left, limited to breakdown of skin (HCC)     Assessment and plan:  Emma Williams is 69 years old she is interested in proceeding with a left total knee arthroplasty however, she has a chronic toe ulcer.  She has been seen by Ortho foot and ankle but she says no specific recommendations were made.  In any event her toe is not well enough to proceed with left total knee arthroplasty so we are going to get a podiatry consult to see if we can get this resolved before we reschedule the surgery  Dr. 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 Etiology is age-related and biomechanical. Non-surgical management preferred. - Instructed Achilles tendon stretching exercises three times daily for one minute. - Recommended stiff sole sneaker to reduce forefoot pressure. - Advised resuming current sneakers once Achilles flexibility improves. - Deferred callus debridement per her preference. - Advised follow-up if symptoms persist after two months of stretching   Meds ordered this encounter  Medications   methylPREDNISolone  acetate (DEPO-MEDROL ) injection 40 mg     Chief Complaint  Patient presents with   Knee Pain    Left/ either wants an injection or wants to proceed with a total knee replacement    Foot Problem    Has problem with left great toe, has seen Dr Shona and Dr  Harden states wants you to look at it, she was advised Dr Harden is who we use and refer to for foot problems, but she wants to know if you will look at it for her    History:  Emma Williams is 69 years old she has been followed by me since June 2025 with osteoarthritis of the left knee she has had 2 injections.  She did not improve.  She wishes to proceed with knee replacement surgery.  Focused exam findings:  Currently she is having breakdown of the toe on the bottom with calcific frequent callus  She will see podiatry to see if we can get this cleared up prior to proceeding with surgery.  When she comes back we will reexamine her knee again and then schedule the surgery if the toe has improved  No results found.    "

## 2024-07-13 NOTE — Progress Notes (Signed)
" ° ° °  07/13/2024   Chief Complaint  Patient presents with   Knee Pain    Left/ either wants an injection or wants to proceed with a total knee replacement    Foot Problem    Has problem with left great toe, has seen Dr Shona and Dr Harden states wants you to look at it, she was advised Dr Harden is who we use and refer to for foot problems, but she wants to know if you will look at it for her    No diagnosis found.  What pharmacy do you use ? ____[facility]_______________________  DOI/DOS/ Date: ongoing  Did you get better, worse or no change (Answer below)   Worse      "

## 2024-07-13 NOTE — Telephone Encounter (Signed)
 Ok thanks

## 2024-07-13 NOTE — Patient Instructions (Signed)
 Call Dr Blinda [phone] for appointment for your toe

## 2024-07-13 NOTE — Telephone Encounter (Signed)
 FYI ONLY - Dr. Areatha pt - pt presented to the office after her appointment, she stated that Dr. Blinda doesn't accept her insurance.  She doesn't want to be referred to anyone else just yet, she has an appointment with Dr. Shona on 07/22/24 and he had mentioned sending her to [facility].  She stated that she'll let us  know.

## 2024-07-22 ENCOUNTER — Other Ambulatory Visit (HOSPITAL_COMMUNITY): Payer: Self-pay | Admitting: Internal Medicine

## 2024-07-22 DIAGNOSIS — L97509 Non-pressure chronic ulcer of other part of unspecified foot with unspecified severity: Secondary | ICD-10-CM

## 2024-07-29 ENCOUNTER — Ambulatory Visit (HOSPITAL_COMMUNITY)
Admission: RE | Admit: 2024-07-29 | Discharge: 2024-07-29 | Disposition: A | Discharge status: Home / Self Care | Source: Ambulatory Visit | Attending: Internal Medicine | Admitting: Internal Medicine

## 2024-07-29 DIAGNOSIS — L97509 Non-pressure chronic ulcer of other part of unspecified foot with unspecified severity: Secondary | ICD-10-CM | POA: Insufficient documentation

## 2024-08-12 ENCOUNTER — Ambulatory Visit: Admitting: Podiatry
